# Patient Record
Sex: Female | Born: 1955 | Race: Black or African American | Hispanic: No | Smoking: Former smoker
Health system: Southern US, Community
[De-identification: ages and names within clinical notes are randomized; demographics above are authoritative.]

## PROBLEM LIST (undated history)

## (undated) ENCOUNTER — Emergency Department (HOSPITAL_BASED_OUTPATIENT_CLINIC_OR_DEPARTMENT_OTHER): Admission: EM | Payer: BLUE CROSS/BLUE SHIELD | Source: Home / Self Care

## (undated) DIAGNOSIS — E785 Hyperlipidemia, unspecified: Secondary | ICD-10-CM

## (undated) DIAGNOSIS — E079 Disorder of thyroid, unspecified: Secondary | ICD-10-CM

## (undated) DIAGNOSIS — N63 Unspecified lump in unspecified breast: Secondary | ICD-10-CM

## (undated) DIAGNOSIS — K648 Other hemorrhoids: Secondary | ICD-10-CM

## (undated) DIAGNOSIS — I10 Essential (primary) hypertension: Secondary | ICD-10-CM

## (undated) DIAGNOSIS — E039 Hypothyroidism, unspecified: Secondary | ICD-10-CM

## (undated) DIAGNOSIS — E119 Type 2 diabetes mellitus without complications: Secondary | ICD-10-CM

## (undated) HISTORY — DX: Unspecified lump in unspecified breast: N63.0

## (undated) HISTORY — DX: Other hemorrhoids: K64.8

## (undated) HISTORY — PX: ABDOMINAL HYSTERECTOMY: SHX81

## (undated) HISTORY — PX: APPENDECTOMY: SHX54

## (undated) HISTORY — PX: DILATION AND CURETTAGE OF UTERUS: SHX78

---

## 1998-01-12 ENCOUNTER — Ambulatory Visit (HOSPITAL_COMMUNITY): Admission: RE | Admit: 1998-01-12 | Discharge: 1998-01-12 | Payer: Self-pay

## 2001-09-23 ENCOUNTER — Other Ambulatory Visit: Admission: RE | Admit: 2001-09-23 | Discharge: 2001-09-23 | Payer: Self-pay

## 2001-10-22 ENCOUNTER — Ambulatory Visit (HOSPITAL_COMMUNITY): Admission: RE | Admit: 2001-10-22 | Discharge: 2001-10-22 | Payer: Self-pay

## 2002-11-24 ENCOUNTER — Ambulatory Visit (HOSPITAL_COMMUNITY): Admission: RE | Admit: 2002-11-24 | Discharge: 2002-11-24 | Payer: Self-pay | Admitting: Family Medicine

## 2004-07-05 ENCOUNTER — Ambulatory Visit (HOSPITAL_COMMUNITY): Admission: RE | Admit: 2004-07-05 | Discharge: 2004-07-05 | Payer: Self-pay | Admitting: Family Medicine

## 2005-11-28 ENCOUNTER — Ambulatory Visit (HOSPITAL_COMMUNITY): Admission: RE | Admit: 2005-11-28 | Discharge: 2005-11-28 | Payer: Self-pay | Admitting: Family Medicine

## 2007-10-22 ENCOUNTER — Encounter: Admission: RE | Admit: 2007-10-22 | Discharge: 2007-10-22 | Payer: Self-pay | Admitting: Family Medicine

## 2008-07-23 ENCOUNTER — Ambulatory Visit (HOSPITAL_COMMUNITY): Admission: RE | Admit: 2008-07-23 | Discharge: 2008-07-23 | Payer: Self-pay | Admitting: Family Medicine

## 2009-09-06 ENCOUNTER — Ambulatory Visit (HOSPITAL_COMMUNITY): Admission: RE | Admit: 2009-09-06 | Discharge: 2009-09-06 | Payer: Self-pay | Admitting: Family Medicine

## 2010-05-24 ENCOUNTER — Encounter: Admission: RE | Admit: 2010-05-24 | Discharge: 2010-05-24 | Payer: Self-pay | Admitting: Family Medicine

## 2010-07-16 ENCOUNTER — Encounter: Payer: Self-pay | Admitting: Family Medicine

## 2011-06-20 ENCOUNTER — Other Ambulatory Visit: Payer: Self-pay | Admitting: Family Medicine

## 2011-06-20 DIAGNOSIS — N6310 Unspecified lump in the right breast, unspecified quadrant: Secondary | ICD-10-CM

## 2011-07-04 ENCOUNTER — Inpatient Hospital Stay: Admission: RE | Admit: 2011-07-04 | Payer: Self-pay | Source: Ambulatory Visit

## 2011-07-11 ENCOUNTER — Ambulatory Visit
Admission: RE | Admit: 2011-07-11 | Discharge: 2011-07-11 | Disposition: A | Discharge status: Home / Self Care | Payer: Medicaid Other | Source: Ambulatory Visit | Attending: Family Medicine | Admitting: Family Medicine

## 2011-07-11 DIAGNOSIS — N6310 Unspecified lump in the right breast, unspecified quadrant: Secondary | ICD-10-CM

## 2011-12-28 ENCOUNTER — Other Ambulatory Visit (HOSPITAL_COMMUNITY)
Admission: RE | Admit: 2011-12-28 | Discharge: 2011-12-28 | Disposition: A | Discharge status: Home / Self Care | Payer: Medicaid Other | Source: Ambulatory Visit | Attending: Obstetrics and Gynecology | Admitting: Obstetrics and Gynecology

## 2011-12-28 ENCOUNTER — Other Ambulatory Visit: Payer: Self-pay | Admitting: Obstetrics and Gynecology

## 2011-12-28 DIAGNOSIS — Z124 Encounter for screening for malignant neoplasm of cervix: Secondary | ICD-10-CM | POA: Insufficient documentation

## 2012-10-06 ENCOUNTER — Other Ambulatory Visit: Payer: Self-pay

## 2012-10-16 ENCOUNTER — Other Ambulatory Visit (HOSPITAL_COMMUNITY): Payer: Self-pay | Admitting: Family Medicine

## 2012-10-16 DIAGNOSIS — Z1231 Encounter for screening mammogram for malignant neoplasm of breast: Secondary | ICD-10-CM

## 2012-10-23 ENCOUNTER — Ambulatory Visit (HOSPITAL_COMMUNITY)
Admission: RE | Admit: 2012-10-23 | Discharge: 2012-10-23 | Disposition: A | Discharge status: Home / Self Care | Payer: Self-pay | Source: Ambulatory Visit | Attending: Family Medicine | Admitting: Family Medicine

## 2012-10-23 DIAGNOSIS — Z1231 Encounter for screening mammogram for malignant neoplasm of breast: Secondary | ICD-10-CM | POA: Insufficient documentation

## 2012-10-27 ENCOUNTER — Other Ambulatory Visit: Payer: Self-pay | Admitting: Family Medicine

## 2012-10-27 DIAGNOSIS — R928 Other abnormal and inconclusive findings on diagnostic imaging of breast: Secondary | ICD-10-CM

## 2012-11-18 ENCOUNTER — Ambulatory Visit (HOSPITAL_COMMUNITY)
Admission: RE | Admit: 2012-11-18 | Discharge: 2012-11-18 | Disposition: A | Discharge status: Home / Self Care | Payer: Medicaid Other | Source: Ambulatory Visit | Attending: Obstetrics and Gynecology | Admitting: Obstetrics and Gynecology

## 2012-11-18 ENCOUNTER — Encounter (HOSPITAL_COMMUNITY): Payer: Self-pay

## 2012-11-18 VITALS — BP 138/84 | Temp 98.7°F | Ht 69.0 in | Wt 218.6 lb

## 2012-11-18 DIAGNOSIS — Z1239 Encounter for other screening for malignant neoplasm of breast: Secondary | ICD-10-CM

## 2012-11-18 HISTORY — DX: Essential (primary) hypertension: I10

## 2012-11-18 NOTE — Progress Notes (Signed)
Patient referred to [facility] by the [facility] due to needing additional imaging of the right breast. Screening mammogram completed 10/23/2012 at [facility].  Pap Smear:    Pap smear not completed today. Last Pap smear was 12/28/2011 and normal. Per patient has no history of an abnormal Pap smear. Patient has a history of a hysterectomy in 2012 for fibroids. Last Pap smear result is in EPIC. Patient does not need any further Pap smears due to her history of a hysterectomy for benign reasons per ACOG and BCCCP guidelines.  Physical exam: Breasts Breasts symmetrical. No skin abnormalities bilateral breasts. No nipple retraction bilateral breasts. No nipple discharge bilateral breasts. No lymphadenopathy. No lumps palpated bilateral breasts. No complaints of pain or tenderness on exam. Referred patient to the [facility] for right breast diagnostic mammogram and possible ultrasound per recommendation. Appointment scheduled for Friday, Nov 21, 2012 at 1100      Pelvic/Bimanual No Pap smear completed today since last Pap smear was 12/28/2011 and a history of a hysterectomy for benign reasons. Pap smear not indicated per BCCCP guidelines.

## 2012-11-18 NOTE — Patient Instructions (Signed)
Taught patient how to perform BSE. Patient did not need a Pap smear today due to last Pap smear was 12/28/2011 and a history of a hysterectomy for benign reasons. Let patient know that she will not need any further Pap smears due to her history of a hysterectomy for benign reasons. Referred patient to the [facility] for right breast diagnostic mammogram and possible ultrasound per recommendation. Appointment scheduled for Friday, Nov 21, 2012 at 1100. Patient aware of appointment and will be there.  Patient verbalized understanding.

## 2012-11-21 ENCOUNTER — Ambulatory Visit
Admission: RE | Admit: 2012-11-21 | Discharge: 2012-11-21 | Disposition: A | Discharge status: Home / Self Care | Payer: No Typology Code available for payment source | Source: Ambulatory Visit | Attending: Family Medicine | Admitting: Family Medicine

## 2012-11-21 DIAGNOSIS — R928 Other abnormal and inconclusive findings on diagnostic imaging of breast: Secondary | ICD-10-CM

## 2013-01-09 ENCOUNTER — Encounter (HOSPITAL_BASED_OUTPATIENT_CLINIC_OR_DEPARTMENT_OTHER): Payer: Self-pay | Admitting: *Deleted

## 2013-01-09 ENCOUNTER — Emergency Department (HOSPITAL_BASED_OUTPATIENT_CLINIC_OR_DEPARTMENT_OTHER)
Admission: EM | Admit: 2013-01-09 | Discharge: 2013-01-09 | Disposition: A | Discharge status: Home / Self Care | Payer: PRIVATE HEALTH INSURANCE | Attending: Emergency Medicine | Admitting: Emergency Medicine

## 2013-01-09 DIAGNOSIS — I10 Essential (primary) hypertension: Secondary | ICD-10-CM | POA: Insufficient documentation

## 2013-01-09 DIAGNOSIS — L02419 Cutaneous abscess of limb, unspecified: Secondary | ICD-10-CM | POA: Insufficient documentation

## 2013-01-09 DIAGNOSIS — Z79899 Other long term (current) drug therapy: Secondary | ICD-10-CM | POA: Insufficient documentation

## 2013-01-09 DIAGNOSIS — L0390 Cellulitis, unspecified: Secondary | ICD-10-CM

## 2013-01-09 MED ORDER — SULFAMETHOXAZOLE-TRIMETHOPRIM 800-160 MG PO TABS: 1.0000 | ORAL_TABLET | Freq: Two times a day (BID) | ORAL | Status: DC

## 2013-01-09 MED ORDER — HYDROCODONE-ACETAMINOPHEN 5-325 MG PO TABS: 1.0000 | ORAL_TABLET | Freq: Four times a day (QID) | ORAL | Status: DC | PRN

## 2013-01-09 MED ORDER — IBUPROFEN 600 MG PO TABS: 600.0000 mg | ORAL_TABLET | Freq: Four times a day (QID) | ORAL | Status: DC | PRN

## 2013-01-09 MED ORDER — SULFAMETHOXAZOLE-TMP DS 800-160 MG PO TABS
1.0000 | ORAL_TABLET | Freq: Once | ORAL | Status: AC
  Administered 2013-01-09: 1 via ORAL
  Filled 2013-01-09: qty 1

## 2013-01-09 NOTE — ED Notes (Signed)
Red, hot, swollen, painful area to her right lower leg. States she feels she was bit by a spider.

## 2013-01-09 NOTE — ED Provider Notes (Signed)
History    CSN: [id number] Arrival date & time 01/09/13  2256  First MD Initiated Contact with Patient 01/09/13 2311     Chief Complaint  Patient presents with  . Rash   (Consider location/radiation/quality/duration/timing/severity/associated sxs/prior Treatment) Patient is a 57 y.o. female presenting with rash. The history is provided by the patient. No language interpreter was used.  Rash Pain location: right lower leg medially and just distal to the knee. Pain quality: aching   Pain radiates to:  Does not radiate Pain severity:  Moderate Onset quality:  Gradual Duration:  1 day Timing:  Constant Progression:  Unchanged Chronicity:  New Context: not trauma   Relieved by:  Nothing Worsened by:  Nothing tried Ineffective treatments:  None tried Associated symptoms: no fever   Risk factors: not pregnant    Past Medical History  Diagnosis Date  . Hypertension    Past Surgical History  Procedure Laterality Date  . Appendectomy     Family History  Problem Relation Age of Onset  . Diabetes Mother   . Hypertension Mother    History  Substance Use Topics  . Smoking status: Never Smoker   . Smokeless tobacco: Never Used  . Alcohol Use: No   OB History   Grav Para Term Preterm Abortions TAB SAB Ect Mult Living   4 2 2  2 2    2      Review of Systems  Constitutional: Negative for fever.  Skin: Positive for rash.  All other systems reviewed and are negative.    Allergies  Review of patient's allergies indicates no known allergies.  Home Medications   Current Outpatient Rx  Name  Route  Sig  Dispense  Refill  . HYDROcodone-acetaminophen (NORCO) 5-325 MG per tablet   Oral   Take 1 tablet by mouth every 6 (six) hours as needed for pain.   10 tablet   0   . ibuprofen (ADVIL,MOTRIN) 600 MG tablet   Oral   Take 1 tablet (600 mg total) by mouth every 6 (six) hours as needed for pain.   30 tablet   0   . levothyroxine (SYNTHROID, LEVOTHROID) 75 MCG  tablet   Oral   Take 75 mcg by mouth daily before breakfast.         . metoprolol succinate (TOPROL-XL) 25 MG 24 hr tablet   Oral   Take 25 mg by mouth daily.         . simvastatin (ZOCOR) 40 MG tablet   Oral   Take 40 mg by mouth every evening.         . sulfamethoxazole-trimethoprim (BACTRIM DS,SEPTRA DS) 800-160 MG per tablet   Oral   Take 1 tablet by mouth 2 (two) times daily. One po bid x 7 days   14 tablet   0    BP 163/87  Pulse 63  Temp(Src) 98.8 F (37.1 C)  Resp 20  Wt 218 lb (98.884 kg)  BMI 32.18 kg/m2  SpO2 98% Physical Exam  Constitutional: She is oriented to person, place, and time. She appears well-developed and well-nourished. No distress.  HENT:  Head: Normocephalic and atraumatic.  Mouth/Throat: Oropharynx is clear and moist.  Eyes: Conjunctivae are normal. Pupils are equal, round, and reactive to light.  Neck: Normal range of motion.  Cardiovascular: Normal rate, regular rhythm and intact distal pulses.   Pulmonary/Chest: Effort normal and breath sounds normal. She has no wheezes. She has no rales.  Abdominal: Soft. Bowel sounds are  normal. There is no tenderness.  Musculoskeletal: Normal range of motion.  Neurological: She is alert and oriented to person, place, and time.  Skin: Skin is warm and dry. There is erythema.     Psychiatric: She has a normal mood and affect.    ED Course  Procedures (including critical care time) Labs Reviewed - No data to display No results found. 1. Cellulitis     MDM  Will treat for community acquired MRSA.  Return for fevers, vomiting, streaking up the legs drainage or any concerns  Terren Haberle K Lewis Grivas-Rasch, MD 01/09/13 2338

## 2013-08-24 ENCOUNTER — Emergency Department (HOSPITAL_BASED_OUTPATIENT_CLINIC_OR_DEPARTMENT_OTHER)
Admission: EM | Admit: 2013-08-24 | Discharge: 2013-08-24 | Disposition: A | Discharge status: Home / Self Care | Payer: BC Managed Care – PPO | Attending: Emergency Medicine | Admitting: Emergency Medicine

## 2013-08-24 ENCOUNTER — Encounter (HOSPITAL_BASED_OUTPATIENT_CLINIC_OR_DEPARTMENT_OTHER): Payer: Self-pay | Admitting: Emergency Medicine

## 2013-08-24 ENCOUNTER — Emergency Department (HOSPITAL_BASED_OUTPATIENT_CLINIC_OR_DEPARTMENT_OTHER): Payer: BC Managed Care – PPO

## 2013-08-24 DIAGNOSIS — E785 Hyperlipidemia, unspecified: Secondary | ICD-10-CM | POA: Insufficient documentation

## 2013-08-24 DIAGNOSIS — R0602 Shortness of breath: Secondary | ICD-10-CM | POA: Insufficient documentation

## 2013-08-24 DIAGNOSIS — I10 Essential (primary) hypertension: Secondary | ICD-10-CM | POA: Insufficient documentation

## 2013-08-24 DIAGNOSIS — R059 Cough, unspecified: Secondary | ICD-10-CM

## 2013-08-24 DIAGNOSIS — E079 Disorder of thyroid, unspecified: Secondary | ICD-10-CM | POA: Insufficient documentation

## 2013-08-24 DIAGNOSIS — R05 Cough: Secondary | ICD-10-CM

## 2013-08-24 DIAGNOSIS — Z79899 Other long term (current) drug therapy: Secondary | ICD-10-CM | POA: Insufficient documentation

## 2013-08-24 DIAGNOSIS — J069 Acute upper respiratory infection, unspecified: Secondary | ICD-10-CM | POA: Insufficient documentation

## 2013-08-24 HISTORY — DX: Hyperlipidemia, unspecified: E78.5

## 2013-08-24 HISTORY — DX: Disorder of thyroid, unspecified: E07.9

## 2013-08-24 MED ORDER — FEXOFENADINE HCL 60 MG PO TABS: 60.0000 mg | ORAL_TABLET | Freq: Two times a day (BID) | ORAL | Status: DC

## 2013-08-24 MED ORDER — HYDROCODONE-HOMATROPINE 5-1.5 MG/5ML PO SYRP: 5.0000 mL | ORAL_SOLUTION | Freq: Four times a day (QID) | ORAL | Status: DC | PRN

## 2013-08-24 NOTE — ED Notes (Signed)
Cold symptoms since Saturday. States started wheezing with coughing today. No history of asthma

## 2013-08-24 NOTE — Discharge Instructions (Signed)
Antibiotic Resistance  Antibiotics are drugs. They fight infections caused by bacteria. Antibiotics greatly reduce illness and death from infectious diseases. Over time, the bacteria that antibiotics once controlled are much harder to kill.  CAUSES   Antibiotic resistance occurs when bacteria change in some way. These changes can lessen the abilities of drugs designed to cure infections. The over-use of antibiotics can cause antibiotic resistance. Almost all important bacterial infections in the world are becoming resistant to drugs. Antibiotic resistance has been called one of the world's most pressing public health problems.   Antibiotics should be used to treat bacterial infections. But they are not effective against viral infections. These include the common cold, most sore throats, and the flu. Smart use of antibiotics will control the spread of resistance.   TREATMENT    Only use antibiotics as prescribed by your caregiver.   Talk with your caregiver about antibiotic resistance.   Ask what else you can do to feel better.   Do not take an antibiotic for a viral infection. This could be a cold, cough or the flu.   Do not save some of your antibiotic for the next time you get sick.   Take an antibiotic exactly as the caregiver tells you.   Do not take an antibiotic that is prescribed for someone else.   Use the antibiotic as directed. Take the correct dose at the scheduled time.  SEEK MEDICAL CARE IF:   You react to the antibiotic with:   A rash.   Itching.   An upset stomach.  Document Released: 09/01/2002 Document Revised: 09/03/2011 Document Reviewed: 04/05/2008  ExitCare Patient Information 2014 [employer].

## 2013-08-24 NOTE — ED Provider Notes (Signed)
CSN: [id number]     Arrival date & time 08/24/13  4098 History   First MD Initiated Contact with Patient 08/24/13 1018     Chief Complaint  Patient presents with  . URI  . Cough     (Consider location/radiation/quality/duration/timing/severity/associated sxs/prior Treatment) HPI Comments: Pt states that she started having cough and congestion 2 days ago. Used otc medications without relief. No definite fever. Worse at night. Non smoking.  The history is provided by the patient. No language interpreter was used.    Past Medical History  Diagnosis Date  . Hypertension   . Hyperlipemia   . Thyroid disease    Past Surgical History  Procedure Laterality Date  . Appendectomy     Family History  Problem Relation Age of Onset  . Diabetes Mother   . Hypertension Mother    History  Substance Use Topics  . Smoking status: Never Smoker   . Smokeless tobacco: Never Used  . Alcohol Use: No   OB History   Grav Para Term Preterm Abortions TAB SAB Ect Mult Living   4 2 2  2 2    2      Review of Systems  Constitutional: Negative.   Respiratory: Positive for cough and shortness of breath.   Cardiovascular: Negative.       Allergies  Review of patient's allergies indicates no known allergies.  Home Medications   Current Outpatient Rx  Name  Route  Sig  Dispense  Refill  . levothyroxine (SYNTHROID, LEVOTHROID) 75 MCG tablet   Oral   Take 75 mcg by mouth daily before breakfast.         . metoprolol succinate (TOPROL-XL) 25 MG 24 hr tablet   Oral   Take 25 mg by mouth daily.         . simvastatin (ZOCOR) 40 MG tablet   Oral   Take 40 mg by mouth every evening.         Marland Kitchen HYDROcodone-acetaminophen (NORCO) 5-325 MG per tablet   Oral   Take 1 tablet by mouth every 6 (six) hours as needed for pain.   10 tablet   0   . ibuprofen (ADVIL,MOTRIN) 600 MG tablet   Oral   Take 1 tablet (600 mg total) by mouth every 6 (six) hours as needed for pain.   30 tablet   0    . sulfamethoxazole-trimethoprim (BACTRIM DS,SEPTRA DS) 800-160 MG per tablet   Oral   Take 1 tablet by mouth 2 (two) times daily. One po bid x 7 days   14 tablet   0    BP 136/81  Pulse 79  Temp(Src) 98.8 F (37.1 C) (Oral)  Resp 18  Wt 210 lb (95.255 kg)  SpO2 99% Physical Exam  Nursing note and vitals reviewed. Constitutional: She is oriented to person, place, and time. She appears well-developed and well-nourished.  HENT:  Right Ear: External ear normal.  Left Ear: External ear normal.  Nose: Mucosal edema present.  Mouth/Throat: Posterior oropharyngeal erythema present.  Eyes: Conjunctivae and EOM are normal. Pupils are equal, round, and reactive to light.  Neck: Normal range of motion. Neck supple.  Cardiovascular: Normal rate and regular rhythm.   Pulmonary/Chest: Effort normal and breath sounds normal.  Abdominal: Soft. Bowel sounds are normal. There is no tenderness.  Musculoskeletal: Normal range of motion.  Neurological: She is alert and oriented to person, place, and time. Coordination normal.  Skin: Skin is warm and dry.  Psychiatric: She has  a normal mood and affect.    ED Course  Procedures (including critical care time) Labs Review Labs Reviewed - No data to display Imaging Review Dg Chest 2 View  08/24/2013   CLINICAL DATA:  Fever and cough  EXAM: CHEST  2 VIEW  COMPARISON:  May 04, 2013  FINDINGS: Lungs are clear. Heart size and pulmonary vascularity are normal. No adenopathy. No bone lesions.  IMPRESSION: No abnormality noted.   Electronically Signed   By: Bretta BangWilliam  Woodruff M.D.   On: 08/24/2013 11:07     EKG Interpretation None      MDM   Final diagnoses:  URI (upper respiratory infection)  Cough    Likely viral. Will treat symptomatically. No need for antibiotics at this time   Teressa LowerVrinda Katelee Schupp, NP 08/24/13 1143

## 2013-08-24 NOTE — ED Provider Notes (Signed)
Medical screening examination/treatment/procedure(s) were performed by non-physician practitioner and as supervising physician I was immediately available for consultation/collaboration.   EKG Interpretation None        Donnice Nielsen, MD 08/24/13 1513 

## 2013-11-07 ENCOUNTER — Encounter (HOSPITAL_BASED_OUTPATIENT_CLINIC_OR_DEPARTMENT_OTHER): Payer: Self-pay | Admitting: Emergency Medicine

## 2013-11-07 ENCOUNTER — Emergency Department (HOSPITAL_BASED_OUTPATIENT_CLINIC_OR_DEPARTMENT_OTHER)
Admission: EM | Admit: 2013-11-07 | Discharge: 2013-11-08 | Discharge status: Against Medical Advice | Payer: BC Managed Care – PPO | Attending: Emergency Medicine | Admitting: Emergency Medicine

## 2013-11-07 DIAGNOSIS — R51 Headache: Secondary | ICD-10-CM | POA: Insufficient documentation

## 2013-11-07 DIAGNOSIS — I10 Essential (primary) hypertension: Secondary | ICD-10-CM | POA: Insufficient documentation

## 2013-11-07 NOTE — ED Notes (Signed)
Pt c/o headache onset x2 days denies Hx of headache or migraines

## 2013-11-07 NOTE — ED Notes (Signed)
Second call to waiting area w/o reply pt eloped

## 2013-11-07 NOTE — ED Notes (Signed)
Call to waiting area w/o eply

## 2014-04-25 DIAGNOSIS — I209 Angina pectoris, unspecified: Secondary | ICD-10-CM

## 2014-04-26 ENCOUNTER — Encounter (HOSPITAL_COMMUNITY)
Admission: RE | Disposition: A | Discharge status: Home / Self Care | Payer: Self-pay | Source: Ambulatory Visit | Attending: Cardiology

## 2014-04-26 ENCOUNTER — Ambulatory Visit (HOSPITAL_COMMUNITY)
Admission: RE | Admit: 2014-04-26 | Discharge: 2014-04-26 | Disposition: A | Discharge status: Home / Self Care | Payer: BC Managed Care – PPO | Source: Ambulatory Visit | Attending: Cardiology | Admitting: Cardiology

## 2014-04-26 ENCOUNTER — Encounter (HOSPITAL_BASED_OUTPATIENT_CLINIC_OR_DEPARTMENT_OTHER): Payer: Self-pay | Admitting: Emergency Medicine

## 2014-04-26 DIAGNOSIS — E785 Hyperlipidemia, unspecified: Secondary | ICD-10-CM | POA: Diagnosis not present

## 2014-04-26 DIAGNOSIS — I209 Angina pectoris, unspecified: Secondary | ICD-10-CM

## 2014-04-26 DIAGNOSIS — I10 Essential (primary) hypertension: Secondary | ICD-10-CM | POA: Insufficient documentation

## 2014-04-26 DIAGNOSIS — E119 Type 2 diabetes mellitus without complications: Secondary | ICD-10-CM | POA: Insufficient documentation

## 2014-04-26 DIAGNOSIS — R0602 Shortness of breath: Secondary | ICD-10-CM | POA: Diagnosis not present

## 2014-04-26 DIAGNOSIS — R079 Chest pain, unspecified: Secondary | ICD-10-CM | POA: Insufficient documentation

## 2014-04-26 HISTORY — PX: LEFT HEART CATHETERIZATION WITH CORONARY ANGIOGRAM: SHX5451

## 2014-04-26 LAB — BASIC METABOLIC PANEL
Anion gap: 13 (ref 5–15)
BUN: 17 mg/dL (ref 6–23)
CHLORIDE: 103 meq/L (ref 96–112)
CO2: 25 meq/L (ref 19–32)
Calcium: 9.8 mg/dL (ref 8.4–10.5)
Creatinine, Ser: 0.96 mg/dL (ref 0.50–1.10)
GFR calc Af Amer: 75 mL/min — ABNORMAL LOW (ref >90)
GFR, EST NON AFRICAN AMERICAN: 65 mL/min — AB (ref >90)
GLUCOSE: 106 mg/dL — AB (ref 70–99)
POTASSIUM: 3.7 meq/L (ref 3.7–5.3)
SODIUM: 141 meq/L (ref 137–147)

## 2014-04-26 LAB — CBC
HEMATOCRIT: 40.9 % (ref 36.0–46.0)
Hemoglobin: 13.9 g/dL (ref 12.0–15.0)
MCH: 29.7 pg (ref 26.0–34.0)
MCHC: 34 g/dL (ref 30.0–36.0)
MCV: 87.4 fL (ref 78.0–100.0)
Platelets: 248 10*3/uL (ref 150–400)
RBC: 4.68 MIL/uL (ref 3.87–5.11)
RDW: 13.8 % (ref 11.5–15.5)
WBC: 6.1 10*3/uL (ref 4.0–10.5)

## 2014-04-26 LAB — PROTIME-INR
INR: 0.99 (ref 0.00–1.49)
Prothrombin Time: 13.2 seconds (ref 11.6–15.2)

## 2014-04-26 LAB — POCT ACTIVATED CLOTTING TIME: Activated Clotting Time: 219 seconds

## 2014-04-26 SURGERY — LEFT HEART CATHETERIZATION WITH CORONARY ANGIOGRAM
Anesthesia: LOCAL

## 2014-04-26 MED ORDER — SODIUM CHLORIDE 0.9 % IJ SOLN: 3.0000 mL | Freq: Two times a day (BID) | INTRAMUSCULAR | Status: DC

## 2014-04-26 MED ORDER — ONDANSETRON HCL 4 MG/2ML IJ SOLN
4.0000 mg | Freq: Once | INTRAMUSCULAR | Status: AC
  Administered 2014-04-26: 4 mg via INTRAVENOUS

## 2014-04-26 MED ORDER — SODIUM CHLORIDE 0.9 % IJ SOLN: 3.0000 mL | INTRAMUSCULAR | Status: DC | PRN

## 2014-04-26 MED ORDER — HEPARIN SODIUM (PORCINE) 1000 UNIT/ML IJ SOLN
INTRAMUSCULAR | Status: AC
  Filled 2014-04-26: qty 1

## 2014-04-26 MED ORDER — SODIUM CHLORIDE 0.9 % IV SOLN
INTRAVENOUS | Status: DC
  Administered 2014-04-26: 06:00:00 via INTRAVENOUS

## 2014-04-26 MED ORDER — LIDOCAINE HCL (PF) 1 % IJ SOLN
INTRAMUSCULAR | Status: AC
  Filled 2014-04-26: qty 30

## 2014-04-26 MED ORDER — HYDROMORPHONE HCL 1 MG/ML IJ SOLN
INTRAMUSCULAR | Status: AC
  Filled 2014-04-26: qty 1

## 2014-04-26 MED ORDER — HEPARIN (PORCINE) IN NACL 2-0.9 UNIT/ML-% IJ SOLN
INTRAMUSCULAR | Status: AC
  Filled 2014-04-26: qty 1000

## 2014-04-26 MED ORDER — SODIUM CHLORIDE 0.9 % IV SOLN: 250.0000 mL | INTRAVENOUS | Status: DC | PRN

## 2014-04-26 MED ORDER — IBUPROFEN 600 MG PO TABS: 600.0000 mg | ORAL_TABLET | Freq: Four times a day (QID) | ORAL | Status: DC | PRN

## 2014-04-26 MED ORDER — MIDAZOLAM HCL 2 MG/2ML IJ SOLN
INTRAMUSCULAR | Status: AC
  Filled 2014-04-26: qty 2

## 2014-04-26 MED ORDER — NITROGLYCERIN 1 MG/10 ML FOR IR/CATH LAB
INTRA_ARTERIAL | Status: AC
  Filled 2014-04-26: qty 10

## 2014-04-26 MED ORDER — VERAPAMIL HCL 2.5 MG/ML IV SOLN
INTRAVENOUS | Status: AC
  Filled 2014-04-26: qty 2

## 2014-04-26 MED ORDER — SODIUM CHLORIDE 0.9 % IV SOLN: 1.0000 mL/kg/h | INTRAVENOUS | Status: DC

## 2014-04-26 MED ORDER — ONDANSETRON HCL 4 MG/2ML IJ SOLN
INTRAMUSCULAR | Status: AC
  Filled 2014-04-26: qty 2

## 2014-04-26 MED ORDER — ASPIRIN 81 MG PO CHEW
81.0000 mg | CHEWABLE_TABLET | ORAL | Status: AC
  Administered 2014-04-26: 81 mg via ORAL

## 2014-04-26 MED ORDER — ASPIRIN 81 MG PO CHEW
CHEWABLE_TABLET | ORAL | Status: AC
  Administered 2014-04-26: 81 mg via ORAL
  Filled 2014-04-26: qty 1

## 2014-04-26 NOTE — Progress Notes (Signed)
Dr. Jacinto HalimGanji by to speak w/patient. CP now 2/10. 12-lead EKG done per order.

## 2014-04-26 NOTE — Progress Notes (Signed)
Patient denies chest pain nor any discomfort.

## 2014-04-26 NOTE — H&P (Signed)
  Please see office visit notes for complete details of HPI.  

## 2014-04-26 NOTE — Discharge Instructions (Signed)
Radial Site Care °Refer to this sheet in the next few weeks. These instructions provide you with information on caring for yourself after your procedure. Your caregiver may also give you more specific instructions. Your treatment has been planned according to current medical practices, but problems sometimes occur. Call your caregiver if you have any problems or questions after your procedure. °HOME CARE INSTRUCTIONS °· You may shower the day after the procedure. Remove the bandage (dressing) and gently wash the site with plain soap and water. Gently pat the site dry. °· Do not apply powder or lotion to the site. °· Do not submerge the affected site in water for 3 to 5 days. °· Inspect the site at least twice daily. °· Do not flex or bend the affected arm for 24 hours. °· No lifting over 5 pounds (2.3 kg) for 5 days after your procedure. °· Do not drive home if you are discharged the same day of the procedure. Have someone else drive you. °· You may drive 24 hours after the procedure unless otherwise instructed by your caregiver. °· Do not operate machinery or power tools for 24 hours. °· A responsible adult should be with you for the first 24 hours after you arrive home. °What to expect: °· Any bruising will usually fade within 1 to 2 weeks. °· Blood that collects in the tissue (hematoma) may be painful to the touch. It should usually decrease in size and tenderness within 1 to 2 weeks. °SEEK IMMEDIATE MEDICAL CARE IF: °· You have unusual pain at the radial site. °· You have redness, warmth, swelling, or pain at the radial site. °· You have drainage (other than a small amount of blood on the dressing). °· You have chills. °· You have a fever or persistent symptoms for more than 72 hours. °· You have a fever and your symptoms suddenly get worse. °· Your arm becomes pale, cool, tingly, or numb. °· You have heavy bleeding from the site. Hold pressure on the site. °Document Released: 07/14/2010 Document Revised:  09/03/2011 Document Reviewed: 07/14/2010 °ExitCare® Patient Information ©2015 [employer]. This information is not intended to replace advice given to you by your health care provider. Make sure you discuss any questions you have with your health care provider. ° °

## 2014-04-26 NOTE — Interval H&P Note (Signed)
History and Physical Interval Note:  04/26/2014 7:49 AM  Patricia AlexandersMarie Rodgers  has presented today for surgery, with the diagnosis of cp  The various methods of treatment have been discussed with the patient and family. After consideration of risks, benefits and other options for treatment, the patient has consented to  Procedure(s): LEFT HEART CATHETERIZATION WITH CORONARY ANGIOGRAM (N/A) and PCI as a surgical intervention .  The patient's history has been reviewed, patient examined, no change in status, stable for surgery.  I have reviewed the patient's chart and labs.  Questions were answered to the patient's satisfaction.    Cath Lab Visit (complete for each Cath Lab visit)  Clinical Evaluation Leading to the Procedure:   ACS: No.  Non-ACS:    Anginal Classification: CCS III  Anti-ischemic medical therapy: Maximal Therapy (2 or more classes of medications)  Non-Invasive Test Results: Intermediate-risk stress test findings: cardiac mortality 1-3%/year  Prior CABG: No previous CABG       [facility] LLC[facility]JI,JAGADEESH R

## 2014-04-26 NOTE — Interval H&P Note (Signed)
History and Physical Interval Note:  04/26/2014 7:48 AM  Patricia AlexandersMarie Rodgers  has presented today for surgery, with the diagnosis of cp  The various methods of treatment have been discussed with the patient and family. After consideration of risks, benefits and other options for treatment, the patient has consented to  Procedure(s): LEFT HEART CATHETERIZATION WITH CORONARY ANGIOGRAM (N/A) and possible PCI  as a surgical intervention .  The patient's history has been reviewed, patient examined, no change in status, stable for surgery.  I have reviewed the patient's chart and labs.  Questions were answered to the patient's satisfaction.     Pamella PertGANJI,JAGADEESH R

## 2014-04-26 NOTE — CV Procedure (Addendum)
Procedure performed:  Left heart catheterization including hemodynamic monitoring of the left ventricle, LV gram, selective right and left coronary arteriography.  Indication patient is a 58 year-old AA female with history of hypertension,  hyperlipidemia, who presents with chest pain suggestive of angina pectoris. Patient has  had non invasive testing which was abnormal revealing ischemia and intermediate risk scan and patient continued to have ntg responsive chest pain.  Hence is brought to the cardiac catheterization lab to evaluate the  coronary anatomy for definitive diagnosis of CAD.  Hemodynamic data:  Left ventricular pressure was 143/5 with LVEDP of 17 mm mercury. Aortic pressure was 142/84 with a mean of 110 mm mercury. There was no pressure gradient across the aortic valve  Left ventricle: Performed in the RAO projection revealed LVEF of 60%. There was no significant MR. no wall motion abnormality. Initial injection of the left ventricle via a 5 [place] Tig catheter lead to intramyocardial injection of the contrast.  Right coronary artery: The vessel is smooth, there is very minimal luminal irregularity. It is Dominant.  Left main coronary artery is large and normal.  Circumflex coronary artery: A large vessel giving origin to a large obtuse marginal 1. It is smooth and normal.   LAD:  LAD gives origin to several small diagonals. The LAD is smooth and normal.  Technique: Under sterile precautions using a 6 French right radial  arterial access, a 6 French sheath was introduced into the right radial artery. A 5 [place] Tig 4 catheter was advanced into the ascending aorta selective  right coronary artery and left coronary artery was cannulated and angiography was performed in multiple views. The catheter was pulled back Out of the body over exchange length J-wire.  Same Catheter was used to perform LV gram which was performed in RAO projection. Catheter exchanged out of the body over  J-Wire. NO immediate complications noted. Patient tolerated the procedure well.   Complication: Initial injection of the left ventricle lead to intramyocardial blush, intramyocardial injection of the contrast, 8 mL of contrast was injected with the hand. Patient did experience chest pain, which improved. Most of the contrast at the end of the procedure had resolved spontaneously.  Bedside echocardiogram was performed to exclude any pericardial effusion or wall motion abnormality. There was no pericardial effusion and there was normal wall motion. The recordings were not saved.  Rec: Medical therapy with aggressive risk factor reduction. Suspect chest pain of noncardiac etiology.  Disposition: Will be discharged home today with outpatient follow up. She'll be observed for 4-6 hours prior to discharge. A total of 50 mL of contrast was utilized for diagnostic angiography.  Addendum: Post procedure in the recovery area, patient completely asymptomatic. No EKG changes. Patient was discharged from the hospital uneventfully. Post follow-up telephone conversation, patient completely asymptomatic except mild coldness in her hand but was able to move her hands well and had no pain. Patient will be followed up in the outpatient basis.

## 2014-04-26 NOTE — Progress Notes (Signed)
NAUSEA AND VOMITING SMALL AMT YELLOW LIQUID AND STATES FEELS BETTER

## 2014-05-13 ENCOUNTER — Other Ambulatory Visit: Payer: Self-pay

## 2014-05-13 DIAGNOSIS — Z1231 Encounter for screening mammogram for malignant neoplasm of breast: Secondary | ICD-10-CM

## 2014-06-03 ENCOUNTER — Encounter (HOSPITAL_COMMUNITY): Payer: Self-pay | Admitting: Cardiology

## 2014-06-07 ENCOUNTER — Ambulatory Visit
Admission: RE | Admit: 2014-06-07 | Discharge: 2014-06-07 | Disposition: A | Discharge status: Home / Self Care | Payer: BC Managed Care – PPO | Source: Ambulatory Visit

## 2014-06-07 DIAGNOSIS — Z1231 Encounter for screening mammogram for malignant neoplasm of breast: Secondary | ICD-10-CM

## 2014-11-24 ENCOUNTER — Encounter: Payer: Self-pay | Admitting: Physician Assistant

## 2014-12-22 ENCOUNTER — Ambulatory Visit: Payer: PRIVATE HEALTH INSURANCE | Admitting: Physician Assistant

## 2015-03-04 ENCOUNTER — Ambulatory Visit: Payer: PRIVATE HEALTH INSURANCE | Admitting: Gastroenterology

## 2015-11-12 ENCOUNTER — Emergency Department (HOSPITAL_BASED_OUTPATIENT_CLINIC_OR_DEPARTMENT_OTHER)
Admission: EM | Admit: 2015-11-12 | Discharge: 2015-11-12 | Disposition: A | Discharge status: Home / Self Care | Payer: BLUE CROSS/BLUE SHIELD | Attending: Emergency Medicine | Admitting: Emergency Medicine

## 2015-11-12 ENCOUNTER — Encounter (HOSPITAL_BASED_OUTPATIENT_CLINIC_OR_DEPARTMENT_OTHER): Payer: Self-pay | Admitting: Emergency Medicine

## 2015-11-12 ENCOUNTER — Emergency Department (HOSPITAL_BASED_OUTPATIENT_CLINIC_OR_DEPARTMENT_OTHER): Payer: BLUE CROSS/BLUE SHIELD

## 2015-11-12 DIAGNOSIS — R0789 Other chest pain: Secondary | ICD-10-CM | POA: Insufficient documentation

## 2015-11-12 DIAGNOSIS — R079 Chest pain, unspecified: Secondary | ICD-10-CM

## 2015-11-12 DIAGNOSIS — Z87891 Personal history of nicotine dependence: Secondary | ICD-10-CM | POA: Insufficient documentation

## 2015-11-12 DIAGNOSIS — R072 Precordial pain: Secondary | ICD-10-CM | POA: Diagnosis not present

## 2015-11-12 DIAGNOSIS — Z79899 Other long term (current) drug therapy: Secondary | ICD-10-CM | POA: Diagnosis not present

## 2015-11-12 DIAGNOSIS — E785 Hyperlipidemia, unspecified: Secondary | ICD-10-CM | POA: Insufficient documentation

## 2015-11-12 DIAGNOSIS — I10 Essential (primary) hypertension: Secondary | ICD-10-CM | POA: Diagnosis not present

## 2015-11-12 DIAGNOSIS — J3489 Other specified disorders of nose and nasal sinuses: Secondary | ICD-10-CM | POA: Diagnosis not present

## 2015-11-12 LAB — CBC
HEMATOCRIT: 38.4 % (ref 36.0–46.0)
HEMOGLOBIN: 13.3 g/dL (ref 12.0–15.0)
MCH: 30.8 pg (ref 26.0–34.0)
MCHC: 34.6 g/dL (ref 30.0–36.0)
MCV: 88.9 fL (ref 78.0–100.0)
Platelets: 216 10*3/uL (ref 150–400)
RBC: 4.32 MIL/uL (ref 3.87–5.11)
RDW: 13.6 % (ref 11.5–15.5)
WBC: 5.1 10*3/uL (ref 4.0–10.5)

## 2015-11-12 LAB — BASIC METABOLIC PANEL
ANION GAP: 6 (ref 5–15)
BUN: 16 mg/dL (ref 6–20)
CO2: 29 mmol/L (ref 22–32)
Calcium: 9.2 mg/dL (ref 8.9–10.3)
Chloride: 103 mmol/L (ref 101–111)
Creatinine, Ser: 0.93 mg/dL (ref 0.44–1.00)
GFR calc Af Amer: >60 mL/min (ref >60)
GLUCOSE: 128 mg/dL — AB (ref 65–99)
POTASSIUM: 3.1 mmol/L — AB (ref 3.5–5.1)
Sodium: 138 mmol/L (ref 135–145)

## 2015-11-12 LAB — TROPONIN I: Troponin I: <0.03 ng/mL (ref <0.031)

## 2015-11-12 MED ORDER — KETOROLAC TROMETHAMINE 10 MG PO TABS: 10.0000 mg | ORAL_TABLET | Freq: Four times a day (QID) | ORAL | Status: DC | PRN

## 2015-11-12 NOTE — ED Provider Notes (Signed)
CSN: [id number]     Arrival date & time 11/12/15  1823 History   First MD Initiated Contact with Patient 11/12/15 1843     Chief Complaint  Patient presents with  . Chest Pain  PT HERE WITH CP.  SHE SAID THAT SHE HAS HAD HEAVINESS STARTING YESTERDAY THAT STARTED AFTER INSTALLING AN AC UNIT.  THE PT HAS ALSO HAD SINUS PROBLEMS.  THE PT HAD A CATH 04/26/14 THAT WAS NL.  SHE WENT TO THE CARDIOLOGIST FOR SIMILAR SX.    (Consider location/radiation/quality/duration/timing/severity/associated sxs/prior Treatment) Patient is a 60 y.o. female presenting with chest pain. The history is provided by the patient.  Chest Pain Pain location:  Substernal area Pain quality: pressure   Pain radiates to:  Does not radiate Pain radiates to the back: no   Pain severity:  Mild Onset quality:  Gradual Timing:  Constant Progression:  Unchanged Chronicity:  Recurrent   Past Medical History  Diagnosis Date  . Hypertension   . Hyperlipemia   . Thyroid disease   . Internal hemorrhoids   . Breast mass    Past Surgical History  Procedure Laterality Date  . Appendectomy    . Left heart catheterization with coronary angiogram N/A 04/26/2014    Procedure: LEFT HEART CATHETERIZATION WITH CORONARY ANGIOGRAM;  Surgeon: Pamella PertJagadeesh R Ganji, MD;  [facility];  Service: Cardiovascular;  Laterality: N/A;  . Abdominal hysterectomy     Family History  Problem Relation Age of Onset  . Diabetes Mother   . Hypertension Mother   . Heart disease Father    Social History  Substance Use Topics  . Smoking status: Former Games developermoker  . Smokeless tobacco: Never Used  . Alcohol Use: No   OB History    Gravida Para Term Preterm AB TAB SAB Ectopic Multiple Living   4 2 2  2 2    2      Review of Systems  HENT: Positive for sinus pressure.   Cardiovascular: Positive for chest pain.  All other systems reviewed and are negative.     Allergies  Review of patient's allergies indicates no known  allergies.  Home Medications   Prior to Admission medications   Medication Sig Start Date End Date Taking? Authorizing Provider  fexofenadine (ALLEGRA) 60 MG tablet Take 1 tablet (60 mg total) by mouth 2 (two) times daily. 08/24/13   Teressa LowerVrinda Pickering, NP  HYDROcodone-acetaminophen (NORCO) 5-325 MG per tablet Take 1 tablet by mouth every 6 (six) hours as needed for pain. 01/09/13   April Palumbo, MD  HYDROcodone-homatropine (HYDROMET) 5-1.5 MG/5ML syrup Take 5 mLs by mouth every 6 (six) hours as needed for cough. Patient not taking: Reported on 04/26/2014 08/24/13   Teressa LowerVrinda Pickering, NP  ibuprofen (ADVIL,MOTRIN) 600 MG tablet Take 1 tablet (600 mg total) by mouth every 6 (six) hours as needed. 04/28/14   Yates DecampJay Ganji, MD  ketorolac (TORADOL) 10 MG tablet Take 1 tablet (10 mg total) by mouth every 6 (six) hours as needed. 11/12/15   Jacalyn LefevreJulie Leven Hoel, MD  levothyroxine (SYNTHROID, LEVOTHROID) 75 MCG tablet Take 75 mcg by mouth daily before breakfast.    Historical Provider, MD  LOSARTAN POTASSIUM PO Take 1 tablet by mouth daily.    Historical Provider, MD  losartan-hydrochlorothiazide (HYZAAR) 100-12.5 MG per tablet Take 1 tablet by mouth daily.    Historical Provider, MD  metoprolol succinate (TOPROL-XL) 25 MG 24 hr tablet Take 25 mg by mouth daily.    Historical Provider, MD  simvastatin (ZOCOR)  40 MG tablet Take 40 mg by mouth every evening.    Historical Provider, MD   BP 131/73 mmHg  Pulse 63  Temp(Src) 98.7 F (37.1 C) (Oral)  Resp 18  Ht  (1.753 m)  Wt 200 lb (90.719 kg)  BMI 29.52 kg/m2  SpO2 97% Physical Exam  Constitutional: She is oriented to person, place, and time. She appears well-developed and well-nourished.  HENT:  Head: Normocephalic and atraumatic.  Right Ear: External ear normal.  Left Ear: External ear normal.  Nose: Nose normal.  Mouth/Throat: Oropharynx is clear and moist.  Eyes: Conjunctivae and EOM are normal. Pupils are equal, round, and reactive to light.  Neck:  Normal range of motion. Neck supple.  Cardiovascular: Normal rate, regular rhythm, normal heart sounds and intact distal pulses.   Pulmonary/Chest: Effort normal and breath sounds normal.  Abdominal: Soft. Bowel sounds are normal.  Musculoskeletal: Normal range of motion.  Neurological: She is alert and oriented to person, place, and time.  Skin: Skin is warm and dry.  Nursing note and vitals reviewed.   ED Course  Procedures (including critical care time) Labs Review Labs Reviewed  BASIC METABOLIC PANEL - Abnormal; Notable for the following:    Potassium 3.1 (*)    Glucose, Bld 128 (*)    All other components within normal limits  CBC  TROPONIN I    Imaging Review Dg Chest 2 View  11/12/2015  CLINICAL DATA:  Acute onset of generalized chest pain and heaviness. Diaphoresis, wheezing, palpitations and chest congestion. Initial encounter. EXAM: CHEST  2 VIEW COMPARISON:  Chest radiograph from 08/24/2013 FINDINGS: The lungs are well-aerated and clear. There is no evidence of focal opacification, pleural effusion or pneumothorax. The heart is normal in size; the mediastinal contour is within normal limits. No acute osseous abnormalities are seen. IMPRESSION: No acute cardiopulmonary process seen. Electronically Signed   By: Roanna Raider M.D.   On: 11/12/2015 19:40   I have personally reviewed and evaluated these images and lab results as part of my medical decision-making.   EKG Interpretation   Date/Time:  Saturday Nov 12 2015 18:44:00 EDT Ventricular Rate:  66 PR Interval:  166 QRS Duration: 116 QT Interval:  428 QTC Calculation: 448 R Axis:   -21 Text Interpretation:  Sinus rhythm Incomplete RBBB and LAFB Baseline  wander in lead(s) V3 Confirmed by Sallee Hogrefe MD, Zana Biancardi ([id number]) on 11/12/2015  7:01:48 PM      MDM  PT HAD A NL CATH 1.5 YEARS AGO.  NL TROP WITH ADDN SX OF URI.  OK FOR D/C.  PT KNOWS TO RETURN IF WORSE. Final diagnoses:  Chest pain, unspecified chest pain type         Jacalyn Lefevre, MD 11/12/15 1954

## 2015-11-12 NOTE — Discharge Instructions (Signed)
Chest Wall Pain °Chest wall pain is pain in or around the bones and muscles of your chest. Sometimes, an injury causes this pain. Sometimes, the cause may not be known. This pain may take several weeks or longer to get better. °HOME CARE °Pay attention to any changes in your symptoms. Take these actions to help with your pain: °· Rest as told by your doctor. °· Avoid activities that cause pain. Try not to use your chest, belly (abdominal), or side muscles to lift heavy things. °· If directed, apply ice to the painful area: °¨ Put ice in a plastic bag. °¨ Place a towel between your skin and the bag. °¨ Leave the ice on for 20 minutes, 2-3 times per day. °· Take over-the-counter and prescription medicines only as told by your doctor. °· Do not use tobacco products, including cigarettes, chewing tobacco, and e-cigarettes. If you need help quitting, ask your doctor. °· Keep all follow-up visits as told by your doctor. This is important. °GET HELP IF: °· You have a fever. °· Your chest pain gets worse. °· You have new symptoms. °GET HELP RIGHT AWAY IF: °· You feel sick to your stomach (nauseous) or you throw up (vomit). °· You feel sweaty or light-headed. °· You have a cough with phlegm (sputum) or you cough up blood. °· You are short of breath. °  °This information is not intended to replace advice given to you by your health care provider. Make sure you discuss any questions you have with your health care provider. °  °Document Released: 11/28/2007 Document Revised: 03/02/2015 Document Reviewed: 09/06/2014 °Elsevier Interactive Patient Education ©2016 Elsevier Inc. ° °

## 2015-11-12 NOTE — ED Notes (Signed)
Presents with c/c 'chest heaviness' since yesterday.  States the heaviness came on fairly suddenly while they where installing an [facility] in her home. Today symptoms progressively worsened with sweats, wheezing and chest congestion.

## 2015-11-12 NOTE — ED Notes (Signed)
MD at bedside. 

## 2016-01-19 ENCOUNTER — Other Ambulatory Visit: Payer: Self-pay

## 2016-01-19 DIAGNOSIS — N644 Mastodynia: Secondary | ICD-10-CM

## 2016-02-01 ENCOUNTER — Other Ambulatory Visit: Payer: Self-pay | Admitting: Family Medicine

## 2016-02-01 DIAGNOSIS — N644 Mastodynia: Secondary | ICD-10-CM

## 2016-02-09 ENCOUNTER — Ambulatory Visit
Admission: RE | Admit: 2016-02-09 | Discharge: 2016-02-09 | Disposition: A | Discharge status: Home / Self Care | Payer: PRIVATE HEALTH INSURANCE | Source: Ambulatory Visit | Attending: Family Medicine | Admitting: Family Medicine

## 2016-02-09 ENCOUNTER — Other Ambulatory Visit: Payer: Self-pay

## 2016-02-09 DIAGNOSIS — N644 Mastodynia: Secondary | ICD-10-CM

## 2016-07-03 ENCOUNTER — Encounter: Payer: Self-pay | Admitting: Neurology

## 2016-07-03 ENCOUNTER — Ambulatory Visit (INDEPENDENT_AMBULATORY_CARE_PROVIDER_SITE_OTHER): Payer: BLUE CROSS/BLUE SHIELD | Admitting: Neurology

## 2016-07-03 VITALS — BP 147/87 | HR 75 | Ht 69.0 in | Wt 206.5 lb

## 2016-07-03 DIAGNOSIS — R202 Paresthesia of skin: Secondary | ICD-10-CM | POA: Insufficient documentation

## 2016-07-03 MED ORDER — MELOXICAM 15 MG PO TABS: 15.0000 mg | ORAL_TABLET | Freq: Every day | ORAL | 1 refills | Status: DC

## 2016-07-03 MED ORDER — METHOCARBAMOL 500 MG PO TABS: 500.0000 mg | ORAL_TABLET | Freq: Three times a day (TID) | ORAL | 1 refills | Status: DC

## 2016-07-03 NOTE — Patient Instructions (Signed)
   We will start Mobic as an anti-inflamatory and robaxin as a muscle relaxant. We will get physical therapy set up.   If no improvement is noted in 2-3 weeks, call our office and I will get a MRI of the neck set up.

## 2016-07-03 NOTE — Progress Notes (Signed)
Reason for visit: Right arm paresthesias and pain  Referring physician: Dr. Jairo Rodgers is a 61 y.o. female  History of present illness:  Patricia Rodgers is a 61 year old right-handed black female with a history of onset of right arm symptoms about 3 weeks prior to this evaluation. She does report a chronic history of neck discomfort and shoulder pain, this has worsened recently as well. The patient is now having some discomfort into the neck and shoulder, worse on the right, with numbness from the shoulder level down to the wrist on the right arm. The patient feels more symptoms when she is sitting, she does not notice the numbness as much when she is up walking and doing things. She feels somewhat better when she is lying down. The patient has pulling sensations in the neck when she looks up, she denies any numbness or pain down the right arm with any head or neck movement. She believes that there may be some slight weakness of the right arm. She denies paresthesias down the left arm or with the legs. She does not have any problems controlling the bowels or the bladder and she denies any balance issues. She is sent to this office for further evaluation of the above symptoms.  Past Medical History:  Diagnosis Date  . Breast mass   . Hyperlipemia   . Hypertension   . Internal hemorrhoids   . Thyroid disease     Past Surgical History:  Procedure Laterality Date  . ABDOMINAL HYSTERECTOMY    . APPENDECTOMY    . LEFT HEART CATHETERIZATION WITH CORONARY ANGIOGRAM N/A 04/26/2014   Procedure: LEFT HEART CATHETERIZATION WITH CORONARY ANGIOGRAM;  Surgeon: Pamella Pert, MD;  Location: [facility] CATH LAB;  Service: Cardiovascular;  Laterality: N/A;    Family History  Problem Relation Age of Onset  . Diabetes Mother   . Hypertension Mother   . Heart disease Mother   . Heart disease Father     Social history:  reports that she quit smoking about 20 years ago. She has never used  smokeless tobacco. She reports that she does not drink alcohol or use drugs.  Medications:  Prior to Admission medications   Medication Sig Start Date End Date Taking? Authorizing Provider  aspirin EC 81 MG tablet Take 81 mg by mouth.   Yes Historical Provider, MD  atorvastatin (LIPITOR) 40 MG tablet TAKE ONE TABLET BY MOUTH ONCE DAILY 04/16/16  Yes Historical Provider, MD  cyanocobalamin (TH VITAMIN B12) 100 MCG tablet Take 100 mcg by mouth daily.    Yes Historical Provider, MD  ferrous sulfate 325 (65 FE) MG tablet Take 325 mg by mouth daily.   Yes Historical Provider, MD  levothyroxine (SYNTHROID, LEVOTHROID) 75 MCG tablet Take 75 mcg by mouth daily before breakfast.   Yes Historical Provider, MD  losartan-hydrochlorothiazide (HYZAAR) 100-12.5 MG per tablet Take 1 tablet by mouth daily.   Yes Historical Provider, MD  meloxicam (MOBIC) 15 MG tablet Take 1 tablet (15 mg total) by mouth daily. 07/03/16   York Spaniel, MD  methocarbamol (ROBAXIN) 500 MG tablet Take 1 tablet (500 mg total) by mouth 3 (three) times daily. 07/03/16   York Spaniel, MD     No Known Allergies  ROS:  Out of a complete 14 system review of symptoms, the patient complains only of the following symptoms, and all other reviewed systems are negative.  Feeling hot, cold Numbness, weakness  Blood pressure (!) 147/87, pulse 75,  height 5\' 9"  (1.753 m), weight 206 lb 8 oz (93.7 kg).  Physical Exam  General: The patient is alert and cooperative at the time of the examination.  Eyes: Pupils are equal, round, and reactive to light. Discs are flat bilaterally.  Neck: The neck is supple, no carotid bruits are noted.  Respiratory: The respiratory examination is clear.  Cardiovascular: The cardiovascular examination reveals a regular rate and rhythm, no obvious murmurs or rubs are noted.  Neuromuscular: The patient lacks about 15 of full lateral rotation of the cervical spine bilaterally.  Skin: Extremities are  without significant edema.  Neurologic Exam  Mental status: The patient is alert and oriented x 3 at the time of the examination. The patient has apparent normal recent and remote memory, with an apparently normal attention span and concentration ability.  Cranial nerves: Facial symmetry is present. There is good sensation of the face to pinprick and soft touch bilaterally. The strength of the facial muscles and the muscles to head turning and shoulder shrug are normal bilaterally. Speech is well enunciated, no aphasia or dysarthria is noted. Extraocular movements are full. Visual fields are full. The tongue is midline, and the patient has symmetric elevation of the soft palate. No obvious hearing deficits are noted.  Motor: The motor testing reveals 5 over 5 strength of all 4 extremities. Good symmetric motor tone is noted throughout.  Sensory: Sensory testing is intact to pinprick, soft touch, vibration sensation, and position sense on all 4 extremities. No evidence of extinction is noted.  Coordination: Cerebellar testing reveals good finger-nose-finger and heel-to-shin bilaterally.  Gait and station: Gait is normal. Tandem gait is normal. Romberg is negative. No drift is seen.  Reflexes: Deep tendon reflexes are symmetric and normal bilaterally. Toes are downgoing bilaterally.   Assessment/Plan:  1. Cervical spine pain, right arm pain and paresthesias  The patient may have a low-grade cervical radiculopathy, there is no reflex asymmetry or weakness at this time. The patient may have sensory symptoms down the right arm solely associated with spasm in the neck and shoulder. The patient will be set up for physical therapy on the neck and shoulders, she will be placed on Robaxin and Mobic. She will follow-up in 3-4 months. If she is not getting better in the next 3 weeks, she is to contact our office and we will set up MRI evaluation of the cervical spine.  Marlan Palau. Keith Eleftheria Taborn MD 07/03/2016 3:56  PM  [facility] [address] [place], [place] [id number]  Phone [phone] Fax [phone]

## 2016-07-16 ENCOUNTER — Encounter: Payer: Self-pay | Admitting: Physical Therapy

## 2016-07-16 ENCOUNTER — Ambulatory Visit: Payer: BLUE CROSS/BLUE SHIELD | Attending: Neurology | Admitting: Physical Therapy

## 2016-07-16 DIAGNOSIS — M436 Torticollis: Secondary | ICD-10-CM

## 2016-07-16 DIAGNOSIS — R293 Abnormal posture: Secondary | ICD-10-CM

## 2016-07-16 DIAGNOSIS — M6281 Muscle weakness (generalized): Secondary | ICD-10-CM | POA: Insufficient documentation

## 2016-07-16 DIAGNOSIS — M542 Cervicalgia: Secondary | ICD-10-CM | POA: Diagnosis present

## 2016-07-16 NOTE — Therapy (Signed)
[facility] [address] [place], [place], [address] Phone: [phone]   Fax:  [phone]  Physical Therapy Evaluation  Patient Details  Name: Patricia Rodgers MRN: [id number] Date of Birth: 1956-01-10 Referring Provider: York Spaniel, MD  Encounter Date: 07/16/2016      PT End of Session - 07/16/16 1303    Visit Number 1   Number of Visits 9   Date for PT Re-Evaluation 08/03/16   Authorization Type [employer]   PT Start Time 1213   PT Stop Time 1255   PT Time Calculation (min) 42 min   Activity Tolerance Patient limited by pain   Behavior During Therapy [facility] for tasks assessed/performed      Past Medical History:  Diagnosis Date  . Breast mass   . Hyperlipemia   . Hypertension   . Internal hemorrhoids   . Thyroid disease     Past Surgical History:  Procedure Laterality Date  . ABDOMINAL HYSTERECTOMY    . APPENDECTOMY    . LEFT HEART CATHETERIZATION WITH CORONARY ANGIOGRAM N/A 04/26/2014   Procedure: LEFT HEART CATHETERIZATION WITH CORONARY ANGIOGRAM;  Surgeon: Pamella Pert, MD;  Location: [facility] CATH LAB;  Service: Cardiovascular;  Laterality: N/A;    There were no vitals filed for this visit.       Subjective Assessment - 07/16/16 1217    Subjective This 61yo female reports onset of right arm paresthesia and pain in November. She reports she awoke one morning with weak & numb feeling. She saw her family physician on 05/30/2016 with diagnosis of Cervicalgia & Cervical Radiculopathy with referral to neurologist. She was evaluated on 07/03/2016 by neurologist, Dr. York Spaniel. Dr. Anne Hahn referred her to PT for 3 weeks of care. If she does not respond to PT, then will have a MRI to further assess. Pt presents for PT evaluation.    Limitations Sitting   How long can you sit comfortably? 20-30 minutes   How long can you stand comfortably? 20 minutes then puts hand in pocket for support   Patient Stated  Goals to work & perform activities without arm pain or issues.    Currently in Pain? Yes   Pain Score 7   in last week, worst 9/10, best 0/10   Pain Location Arm   Pain Orientation Right   Pain Descriptors / Indicators Aching;Tingling;Other (Comment)  electrifying   Pain Type Acute pain   Pain Onset 1 to 4 weeks ago   Pain Frequency Intermittent   Aggravating Factors  sitting,    Pain Relieving Factors stretching arm   Effect of Pain on Daily Activities limits sitting            [facility] PT Assessment - 07/16/16 1215      Assessment   Medical Diagnosis parathesia right arm   Referring Provider York Spaniel, MD   Onset Date/Surgical Date --  Nov 2017   Hand Dominance Right   Prior Therapy none     Precautions   Precautions None     Balance Screen   Has the patient fallen in the past 6 months No   Has the patient had a decrease in activity level because of a fear of falling?  No   Is the patient reluctant to leave their home because of a fear of falling?  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     Sensation   Light Touch Appears Intact     Posture/Postural Control   Posture/Postural Control Postural limitations   Postural Limitations Rounded Shoulders;Forward head     Tone   Assessment Location Right Upper Extremity     ROM / Strength   AROM / PROM / Strength PROM;Strength     PROM   Overall PROM  Deficits   Overall PROM Comments right shoulder & elbow WFL   PROM Assessment Site Cervical   Cervical Flexion 44  "feels good"   Cervical Extension 31  pain in posterior, right cervical & upper trapezius   Cervical - Right Side Bend 31  stretch pain left Trapezius   Cervical - Left Side Bend 37  stretch pain right Trapezius   Cervical - Right Rotation 41  "feels good"   Cervical - Left  Rotation 48  stretch pain in right Trapezius     Strength   Overall Strength Deficits   Overall Strength Comments Right shoulder flexion, abduction & elbow flex /ext 5/5   Strength Assessment Site Cervical;Hand   Right/Left hand Right;Left   Right Hand Grip (lbs) 42#  norm for age is 57.3# +/- 12.5#, so 2.8# below range   Left Hand Grip (lbs) 52#  age norm is 4347.3 +/- 11.9#, so on upper side of norm scale   Cervical Flexion 4/5   Cervical Extension 4/5   Cervical - Right Side Bend 4/5   Cervical - Left Side Bend 4/5   Cervical - Right Rotation 4/5   Cervical - Left Rotation 4/5     Palpation   Patella mobility Tightness & tenderness in right upper Trapezius & Rhomboids. No tenderness at rotator cuff muscles.      Special Tests    Special Tests Cervical   Cervical Tests Dictraction;Spurling's;Vertebral Artery Test     Spurling's   Findings Negative     Distraction Test   Findngs Negative     Vertebral Artery Test    Findings Negative     RUE Tone   RUE Tone Within Functional Limits                                PT Long Term Goals - 07/16/16 1309      PT LONG TERM GOAL #1   Title Patient demonstrates understanding of ongoing HEP. (Target Date: 08/03/2016)   Time 3   Period Weeks   Status New     PT LONG TERM GOAL #2   Title Patient verbalizes pain in RUE </= 2/10. (Target Date: 08/03/2016)   Time 3   Period Weeks   Status New     PT LONG TERM GOAL #3   Title RUE grip strength within normal for her age (>45#) (Target Date: 08/03/2016)   Time 3   Period Weeks   Status New     PT LONG TERM GOAL #4   Title Patient verbalizes understanding of ergonomics & posture for work set-up & bed/ sleeping. (Target Date: 08/03/2016)   Time 3   Period Weeks   Status New               Plan - 07/16/16 1304    Clinical Impression Statement This 61yo female has onset of right arm parathesia and weakness in November. 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 Use of computer for >20min brings on symptoms and discomfort. Patient has improper posture including head forward & rounded shoulders which negatively impact cervical issues. Patient has decreased cervical range and weakness. She has impaired grip in dominant UE. She appears would benefit from skilled PT.                          Rehab Potential Good   PT Frequency 3x / week   PT Duration 3 weeks   PT Treatment/Interventions ADLs/Self Care Home Management;Electrical Stimulation;Cryotherapy;Moist Heat;Ultrasound;Traction;Functional mobility training;Therapeutic activities;Therapeutic exercise;Patient/family education;Manual techniques;Passive range of motion   PT Next Visit Plan Manual therapy & modalities for pain. Instruct in HEP for posture & cervical ROM.    Consulted and Agree with Plan of Care Patient      Patient will benefit from skilled therapeutic intervention in order to improve the following deficits and impairments:  Decreased activity tolerance, Decreased range of motion, Decreased strength, Impaired flexibility, Postural dysfunction, Pain, Improper body mechanics  Visit Diagnosis: Cervicalgia  Muscle weakness (generalized)  Stiffness of neck  Abnormal posture     Problem List Patient Active Problem List   Diagnosis Date Noted  . Paresthesia of right upper extremity 07/03/2016  . Angina pectoris (HCC) 04/25/2014    Kira Hartl PT, DPT 07/16/2016, 1:14 PM  [facility] [address] [place], [place], [address] Phone: [phone]   Fax:  [phone]  Name: Ersa Delaney MRN: [id number] Date of Birth: 03-21-1956

## 2016-07-18 ENCOUNTER — Ambulatory Visit: Payer: BLUE CROSS/BLUE SHIELD | Admitting: Physical Therapy

## 2016-07-18 ENCOUNTER — Encounter: Payer: Self-pay | Admitting: Physical Therapy

## 2016-07-18 DIAGNOSIS — M436 Torticollis: Secondary | ICD-10-CM

## 2016-07-18 DIAGNOSIS — M542 Cervicalgia: Secondary | ICD-10-CM | POA: Diagnosis not present

## 2016-07-18 DIAGNOSIS — M6281 Muscle weakness (generalized): Secondary | ICD-10-CM

## 2016-07-18 DIAGNOSIS — R293 Abnormal posture: Secondary | ICD-10-CM

## 2016-07-18 NOTE — Patient Instructions (Addendum)
Pectoralis Stretch: Supine (Towel Roll)    Lie with rolled towel/foam pool noodle under spine, letting shoulders relax toward floor, arms out away from body (not higher that shoulder level). Lie _1-4__ minutes. Do _1-2__ times per day.  http://ss.exer.us/355   Copyright  VHI. All rights reserved.    Roll    Slowly roll shoulder in backward direction, in pain free range. Repeat _10_ times. Do _1-2_ times per day.  Copyright  VHI. All rights reserved.    The Stretch Shown is for the L side, you should perform on both sides.  1) start in a good postured sitting position 2) grasp the chair with the arm on the side to be stretched or place hand under buttocks 3) the grasp is just enough pressure to keep the shoulder blade down 4) turn your head away and slightly downwards into the opposite armpit 5) reach with your R hand to the back L of your head and apply slight overpressure until a gentle stretch is felt  Sleeping on Side    Place pillow between knees. Use cervical support under neck and a roll around waist as needed.   Copyright  VHI. All rights reserved.  Sleeping on Back    Place pillow under knees. A pillow with cervical support and a roll around waist are also helpful.   Copyright  VHI. All rights reserved.

## 2016-07-19 NOTE — Therapy (Signed)
[facility] [address] [place], [place], [address] Phone: [phone]   Fax:  [phone]  Physical Therapy Treatment  Patient Details  Name: Patricia Rodgers MRN: [id number] Date of Birth: 02-10-1956 Referring Provider: York Spaniel, MD  Encounter Date: 07/18/2016      PT End of Session - 07/18/16 1242    Visit Number 2   Number of Visits 9   Date for PT Re-Evaluation 08/03/16   Authorization Type BCBS   PT Start Time 1235   PT Stop Time 1315   PT Time Calculation (min) 40 min   Activity Tolerance Patient limited by pain   Behavior During Therapy [facility] for tasks assessed/performed      Past Medical History:  Diagnosis Date  . Breast mass   . Hyperlipemia   . Hypertension   . Internal hemorrhoids   . Thyroid disease     Past Surgical History:  Procedure Laterality Date  . ABDOMINAL HYSTERECTOMY    . APPENDECTOMY    . LEFT HEART CATHETERIZATION WITH CORONARY ANGIOGRAM N/A 04/26/2014   Procedure: LEFT HEART CATHETERIZATION WITH CORONARY ANGIOGRAM;  Surgeon: Pamella Pert, MD;  Location: [facility] CATH LAB;  Service: Cardiovascular;  Laterality: N/A;    There were no vitals filed for this visit.      Subjective Assessment - 07/18/16 1241    Subjective No new complaints. Still with pain in right UE.   Limitations Sitting   How long can you sit comfortably? 20-30 minutes   How long can you stand comfortably? 20 minutes then puts hand in pocket for support   Patient Stated Goals to work & perform activities without arm pain or issues.    Currently in Pain? Yes   Pain Score 7    Pain Location Arm   Pain Orientation Right   Pain Descriptors / Indicators Aching;Tingling   Pain Type Acute pain   Pain Onset 1 to 4 weeks ago   Pain Frequency Intermittent   Aggravating Factors  prolonged sitting   Pain Relieving Factors stretching arm            [facility] Adult PT Treatment/Exercise - 07/18/16 1243        Manual Therapy   Manual Therapy Myofascial release;Manual Traction;Scapular mobilization;Neural Stretch   Manual therapy comments manual therapy performed in hook lying on mat table for decreased pain, decreased radicular symtoms, and for increased mobility. Pt did report decreased symptoms in arm to none after manual therapy, specifically manual cervical traction.                    Soft tissue mobilization to cervical paraspinals, upper traps, rhomboids, scalenes and STM   Myofascial Release to upper traps, rhomboids    Manual Traction gentle cervical distraction for 30-40 sec's hold x 6 reps; suboccipital release for 15 sec's x 6 reps.    Neural Stretch passive stretching of uppper traps, scalenes and STM bil sides.     issued the following to HEP: Pectoralis Stretch: Supine (Towel Roll)    Lie with rolled towel/foam pool noodle under spine, letting shoulders relax toward floor, arms out away from body (not higher that shoulder level). Lie _1-4__ minutes. Do _1-2__ times per day.  http://ss.exer.us/355   Copyright  VHI. All rights reserved.    Roll    Slowly roll shoulder in backward direction, in pain free range. Repeat _10_ times. Do _1-2_ times per day.  Copyright  VHI. All rights reserved.  The Stretch Shown is for the L side, you should perform on both sides.  1) start in a good postured sitting position 2) grasp the chair with the arm on the side to be stretched or place hand under buttocks 3) the grasp is just enough pressure to keep the shoulder blade down 4) turn your head away and slightly downwards into the opposite armpit 5) reach with your R hand to the back L of your head and apply slight overpressure until a gentle stretch is felt  Sleeping on Side    Place pillow between knees. Use cervical support under neck and a roll around waist as needed.   Copyright  VHI. All rights reserved.  Sleeping on Back    Place pillow under knees. A  pillow with cervical support and a roll around waist are also helpful.   Copyright  VHI. All rights reserved.          PT Education - 07/18/16 1314    Education provided Yes   Education Details HEP for cervical strengthening; sleeping position on side and back   Person(s) Educated Patient   Methods Explanation;Demonstration;Handout;Verbal cues   Comprehension Verbalized understanding;Returned demonstration;Verbal cues required;Need further instruction             PT Long Term Goals - 07/16/16 1309      PT LONG TERM GOAL #1   Title Patient demonstrates understanding of ongoing HEP. (Target Date: 08/03/2016)   Time 3   Period Weeks   Status New     PT LONG TERM GOAL #2   Title Patient verbalizes pain in RUE </= 2/10. (Target Date: 08/03/2016)   Time 3   Period Weeks   Status New     PT LONG TERM GOAL #3   Title RUE grip strength within normal for her age (>45#) (Target Date: 08/03/2016)   Time 3   Period Weeks   Status New     PT LONG TERM GOAL #4   Title Patient verbalizes understanding of ergonomics & posture for work set-up & bed/ sleeping. (Target Date: 08/03/2016)   Time 3   Period Weeks   Status New            Plan - 07/18/16 1243    Clinical Impression Statement Today's skilled session focused on decreasing pain and increasing cervical motions through stretching. Pt reported no UE symptoms post manual therapy today. No issues reported with HEP  that was issued today. Pt is making steady progress toward goals and should benefit from continued PT to progress toward unmet goals.                                       Rehab Potential Good   PT Frequency 3x / week   PT Duration 3 weeks   PT Treatment/Interventions ADLs/Self Care Home Management;Electrical Stimulation;Cryotherapy;Moist Heat;Ultrasound;Traction;Functional mobility training;Therapeutic activities;Therapeutic exercise;Patient/family education;Manual techniques;Passive range of motion   PT Next Visit  Plan Manual therapy & modalities for pain. continued to work on posture and cervical ROM   Consulted and Agree with Plan of Care Patient      Patient will benefit from skilled therapeutic intervention in order to improve the following deficits and impairments:  Decreased activity tolerance, Decreased range of motion, Decreased strength, Impaired flexibility, Postural dysfunction, Pain, Improper body mechanics  Visit Diagnosis: Cervicalgia  Muscle weakness (generalized)  Stiffness of neck  Abnormal posture  Problem List Patient Active Problem List   Diagnosis Date Noted  . Paresthesia of right upper extremity 07/03/2016  . Angina pectoris (HCC) 04/25/2014    Sallyanne Kuster, PTA, [facility] [address] [place], [place] [address] [phone] 07/19/16, 12:01 PM   Name: Patricia Rodgers MRN: [id number] Date of Birth: 1956/05/16

## 2016-07-20 ENCOUNTER — Ambulatory Visit: Payer: BLUE CROSS/BLUE SHIELD | Admitting: Physical Therapy

## 2016-07-23 ENCOUNTER — Ambulatory Visit: Payer: BLUE CROSS/BLUE SHIELD | Admitting: Physical Therapy

## 2016-07-24 ENCOUNTER — Ambulatory Visit: Payer: BLUE CROSS/BLUE SHIELD | Admitting: Physical Therapy

## 2016-07-27 ENCOUNTER — Ambulatory Visit: Payer: BLUE CROSS/BLUE SHIELD | Admitting: Physical Therapy

## 2016-07-31 ENCOUNTER — Ambulatory Visit: Payer: BLUE CROSS/BLUE SHIELD | Admitting: Physical Therapy

## 2016-08-01 ENCOUNTER — Ambulatory Visit: Payer: BLUE CROSS/BLUE SHIELD | Admitting: Physical Therapy

## 2016-08-03 ENCOUNTER — Ambulatory Visit: Payer: BLUE CROSS/BLUE SHIELD | Admitting: Physical Therapy

## 2016-11-29 ENCOUNTER — Other Ambulatory Visit: Payer: Self-pay | Admitting: Family Medicine

## 2016-11-29 DIAGNOSIS — R109 Unspecified abdominal pain: Secondary | ICD-10-CM

## 2017-01-29 ENCOUNTER — Other Ambulatory Visit: Payer: Self-pay | Admitting: Family Medicine

## 2017-01-29 ENCOUNTER — Other Ambulatory Visit: Payer: Self-pay | Admitting: Internal Medicine

## 2017-01-29 DIAGNOSIS — Z1231 Encounter for screening mammogram for malignant neoplasm of breast: Secondary | ICD-10-CM

## 2017-02-11 ENCOUNTER — Ambulatory Visit: Payer: BLUE CROSS/BLUE SHIELD

## 2017-02-20 ENCOUNTER — Ambulatory Visit
Admission: RE | Admit: 2017-02-20 | Discharge: 2017-02-20 | Disposition: A | Discharge status: Home / Self Care | Payer: BLUE CROSS/BLUE SHIELD | Source: Ambulatory Visit | Attending: Internal Medicine | Admitting: Internal Medicine

## 2017-02-20 DIAGNOSIS — Z1231 Encounter for screening mammogram for malignant neoplasm of breast: Secondary | ICD-10-CM

## 2017-02-21 ENCOUNTER — Other Ambulatory Visit: Payer: Self-pay | Admitting: Internal Medicine

## 2017-02-21 DIAGNOSIS — R928 Other abnormal and inconclusive findings on diagnostic imaging of breast: Secondary | ICD-10-CM

## 2017-02-27 ENCOUNTER — Other Ambulatory Visit: Payer: BLUE CROSS/BLUE SHIELD

## 2017-03-01 ENCOUNTER — Other Ambulatory Visit: Payer: Self-pay | Admitting: Internal Medicine

## 2017-03-01 ENCOUNTER — Ambulatory Visit
Admission: RE | Admit: 2017-03-01 | Discharge: 2017-03-01 | Disposition: A | Discharge status: Home / Self Care | Payer: BLUE CROSS/BLUE SHIELD | Source: Ambulatory Visit | Attending: Internal Medicine | Admitting: Internal Medicine

## 2017-03-01 DIAGNOSIS — N6489 Other specified disorders of breast: Secondary | ICD-10-CM

## 2017-03-01 DIAGNOSIS — R928 Other abnormal and inconclusive findings on diagnostic imaging of breast: Secondary | ICD-10-CM

## 2017-03-05 ENCOUNTER — Ambulatory Visit
Admission: RE | Admit: 2017-03-05 | Discharge: 2017-03-05 | Disposition: A | Discharge status: Home / Self Care | Payer: BLUE CROSS/BLUE SHIELD | Source: Ambulatory Visit | Attending: Internal Medicine | Admitting: Internal Medicine

## 2017-03-05 ENCOUNTER — Other Ambulatory Visit: Payer: Self-pay | Admitting: Internal Medicine

## 2017-03-05 DIAGNOSIS — N6489 Other specified disorders of breast: Secondary | ICD-10-CM

## 2017-03-21 ENCOUNTER — Other Ambulatory Visit: Payer: Self-pay | Admitting: Surgery

## 2017-03-21 DIAGNOSIS — N649 Disorder of breast, unspecified: Secondary | ICD-10-CM

## 2017-03-26 ENCOUNTER — Other Ambulatory Visit: Payer: Self-pay | Admitting: Surgery

## 2017-03-26 DIAGNOSIS — N649 Disorder of breast, unspecified: Secondary | ICD-10-CM

## 2017-04-04 ENCOUNTER — Encounter: Payer: Self-pay | Admitting: Physical Therapy

## 2017-04-04 NOTE — Therapy (Signed)
[facility] [address] [place], [place], [address] Phone: [phone]   Fax:  [phone]  Patient Details  Name: Patricia Rodgers MRN: [id number] Date of Birth: Apr 07, 1956 Referring Provider:  Kathrynn Ducking, MD  Encounter Date: 04/04/2017  PHYSICAL THERAPY DISCHARGE SUMMARY  Visits from Start of Care: 2  Current functional level related to goals / functional outcomes: Patient did not return after 2nd visit so unknown level at discharge.    Remaining deficits: Patient did not return after 2nd visit so unknown level at discharge.    Education / Equipment: Initial HEP  Plan: Patient agrees to discharge.  Patient goals were not met. Patient is being discharged due to not returning since the last visit.  ?????         Patricia Rodgers  PT, DPT 04/04/2017, 10:34 AM  [facility] [address] [place], [place], [address] Phone: [phone]   Fax:  [phone]

## 2017-05-07 ENCOUNTER — Encounter (HOSPITAL_BASED_OUTPATIENT_CLINIC_OR_DEPARTMENT_OTHER): Payer: Self-pay | Admitting: *Deleted

## 2017-05-07 ENCOUNTER — Encounter (HOSPITAL_BASED_OUTPATIENT_CLINIC_OR_DEPARTMENT_OTHER)
Admission: RE | Admit: 2017-05-07 | Discharge: 2017-05-07 | Disposition: A | Discharge status: Home / Self Care | Payer: BLUE CROSS/BLUE SHIELD | Source: Ambulatory Visit | Attending: Surgery | Admitting: Surgery

## 2017-05-07 ENCOUNTER — Other Ambulatory Visit: Payer: Self-pay

## 2017-05-07 DIAGNOSIS — Z01810 Encounter for preprocedural cardiovascular examination: Secondary | ICD-10-CM | POA: Insufficient documentation

## 2017-05-07 DIAGNOSIS — E119 Type 2 diabetes mellitus without complications: Secondary | ICD-10-CM | POA: Insufficient documentation

## 2017-05-07 DIAGNOSIS — Z01812 Encounter for preprocedural laboratory examination: Secondary | ICD-10-CM | POA: Insufficient documentation

## 2017-05-07 DIAGNOSIS — R001 Bradycardia, unspecified: Secondary | ICD-10-CM | POA: Insufficient documentation

## 2017-05-07 DIAGNOSIS — I10 Essential (primary) hypertension: Secondary | ICD-10-CM | POA: Insufficient documentation

## 2017-05-07 LAB — BASIC METABOLIC PANEL
Anion gap: 9 (ref 5–15)
BUN: 15 mg/dL (ref 6–20)
CALCIUM: 9.5 mg/dL (ref 8.9–10.3)
CO2: 24 mmol/L (ref 22–32)
Chloride: 102 mmol/L (ref 101–111)
Creatinine, Ser: 0.88 mg/dL (ref 0.44–1.00)
GFR calc Af Amer: >60 mL/min (ref >60)
GLUCOSE: 83 mg/dL (ref 65–99)
POTASSIUM: 3.9 mmol/L (ref 3.5–5.1)
Sodium: 135 mmol/L (ref 135–145)

## 2017-05-07 NOTE — Progress Notes (Signed)
EKG reviewed by Dr. Renold DonGermeroth, will proceed with surgery as scheduled.  Ensure pre surgery drink given with instructions to drink by 0930 dos, pt verbalized understanding.

## 2017-05-10 ENCOUNTER — Ambulatory Visit
Admission: RE | Admit: 2017-05-10 | Discharge: 2017-05-10 | Disposition: A | Discharge status: Home / Self Care | Payer: BLUE CROSS/BLUE SHIELD | Source: Ambulatory Visit | Attending: Surgery | Admitting: Surgery

## 2017-05-10 DIAGNOSIS — N649 Disorder of breast, unspecified: Secondary | ICD-10-CM

## 2017-05-12 NOTE — H&P (Signed)
Patricia CulverMarie I Rodgers  [facility] Patient #: [id number] DOB: [birth year] Separated / Language: [place] / Race: Black or African American Female  History of Present Illness   The patient is a 61 year old female who presents with a complaint of lesion in right breast.  The PCP is Dr. Reece AgarG. Osei-Bonsu.  She come with her son, [name], and DIL, [name]  She gets annual mammograms. She's had no prior breast biopsy. She had a partial hysterectomy in 2012, but this was for benign disease. Her gynecologist was Dr. Mila PalmerE Green. She is not on hormone replacement therapy. She has no family history of breast cancer. She had mammograms at the breast center on 20 February 2017 which saw some distortion in the right breast. Repeat mammograms on 01 March 2017 showed distortion in the upper outer quadrant of the right breast. She underwent a right breast biopsy on 05 March 2017 ([id number]) which showed a CSL.  I gave her a copy of the path report. I gave her a booklet on breast biopsies. I explained the indications and risk of breast biopsy. The risks include bleeding, infection, and nerve injury. And if there is a cancer, she may require additional surgery. She is going on a cruise at the end of October and wanted to wait after she got back from the surgery. This should be okay.  Past Medical History: 1. HTN 2. Hypothryoid 3. History of appendectomy - at age 61 yo 4. Back pain - arthritis 5. MRI of her back suggested that she has one small kidney stone. She has never had symptoms.  Social History: Separated. Has 2 sons She come with her son, [name], and DIL, [name] Works accounts payable for [employer]  Past Surgical History ([place]; 03/21/2017 10:26 AM) Appendectomy  Breast Biopsy  Right. Hysterectomy (not due to cancer) - Partial   Diagnostic Studies History (April Staton, CMA; 03/21/2017 10:26 AM) Colonoscopy  5-10 years  ago Mammogram  within last year  Allergies (April Staton, CMA; 03/21/2017 10:28 AM) No Known Drug Allergies 03/21/2017  Medication History (April Staton, CMA; 03/21/2017 10:31 AM) Aspirin (81MG  Tablet, Oral) Active. Atorvastatin Calcium (40MG  Tablet, Oral) Active. Vitamin B12 (100MCG Tablet, Oral) Active. Iron (Ferrous Gluconate) (325MG  Tablet, Oral) Active. Levothyroxine Sodium (75MCG Tablet, Oral) Active. Losartan Potassium-HCTZ (100-12.5MG  Tablet, Oral) Active. Meloxicam (15MG  Tablet, Oral) Active. Robaxin (500MG  Tablet, Oral) Active. MetFORMIN HCl (Oral) Specific strength unknown - Active. Medications Reconciled  Social History ([place]; 03/21/2017 10:26 AM) Alcohol use  Remotely quit alcohol use. Tobacco use  Former smoker.  Family History ([place]; 03/21/2017 10:26 AM) Diabetes Mellitus  Mother. Heart Disease  Father. Hypertension  Mother.  Pregnancy / Birth History (April Joana ReamerStaton, [place]; 03/21/2017 10:26 AM) Age at menarche  13 years. Age of menopause  8756-60 Gravida  4 Irregular periods  Maternal age  60-25 Para  2  Other Problems (April Staton, CMA; 03/21/2017 10:26 AM) Thyroid Disease     Review of Systems (April Staton CMA; 03/21/2017 10:26 AM) General Not Present- Appetite Loss, Chills, Fatigue, Fever, Night Sweats, Weight Gain and Weight Loss. Skin Not Present- Change in Wart/Mole, Dryness, Hives, Jaundice, New Lesions, Non-Healing Wounds, Rash and Ulcer. HEENT Not Present- Earache, Hearing Loss, Hoarseness, Nose Bleed, Oral Ulcers, Ringing in the Ears, Seasonal Allergies, Sinus Pain, Sore Throat, Visual Disturbances, Wears glasses/contact lenses and Yellow Eyes. Respiratory Not Present- Bloody sputum, Chronic Cough, Difficulty Breathing, Snoring and Wheezing. Breast Not Present- Breast Mass, Breast Pain, Nipple Discharge and  Skin Changes. Cardiovascular Not Present- Chest Pain, Difficulty Breathing Lying Down, Leg Cramps,  Palpitations, Rapid Heart Rate, Shortness of Breath and Swelling of Extremities. Gastrointestinal Not Present- Abdominal Pain, Bloating, Bloody Stool, Change in Bowel Habits, Chronic diarrhea, Constipation, Difficulty Swallowing, Excessive gas, Gets full quickly at meals, Hemorrhoids, Indigestion, Nausea, Rectal Pain and Vomiting. Female Genitourinary Not Present- Frequency, Nocturia, Painful Urination, Pelvic Pain and Urgency. Musculoskeletal Not Present- Back Pain, Joint Pain, Joint Stiffness, Muscle Pain, Muscle Weakness and Swelling of Extremities. Neurological Not Present- Decreased Memory, Fainting, Headaches, Numbness, Seizures, Tingling, Tremor, Trouble walking and Weakness. Psychiatric Not Present- Anxiety, Bipolar, Change in Sleep Pattern, Depression, Fearful and Frequent crying. Endocrine Present- Hot flashes and New Diabetes. Not Present- Cold Intolerance, Excessive Hunger, Hair Changes and Heat Intolerance. Hematology Not Present- Blood Thinners, Easy Bruising, Excessive bleeding, Gland problems, HIV and Persistent Infections.  Vitals (April Staton CMA; 03/21/2017 10:31 AM) 03/21/2017 10:31 AM Weight: 201.38 lb Height: 69in Body Surface Area: 2.07 m Body Mass Index: 29.74 kg/m  Temp.: 98.1F(Oral)  Pulse: 56 (Regular)  BP: 138/88 (Sitting, Left Arm, Standard)     Physical Exam  General: WN AA F alert and generally healthy appearing. HEENT: Normal. Pupils equal.  Neck: Supple. No mass. No thyroid mass.  Lymph Nodes: No supraclavicular or cervical nodes.  Lungs: Clear to auscultation and symmetric breath sounds. Heart: RRR. No murmur or rub.  Breast: Right - Large and pendulous breast. Still has steristrips - I remvoed them. She has a little mass effect in the UOQ of the right breast, but I think that this is from the biopsy. Left - Large and pendulous breast. No mass.  Abdomen: Soft. No mass. No tenderness. No hernia. Normal bowel sounds.   Lower midlein scar from appendectomy at age 89  Extremities: Good strength and ROM in upper and lower extremities.  Neurologic: Grossly intact to motor and sensory function. Psychiatric: Has normal mood and affect. Behavior is normal.   Assessment & Plan  1.  BREAST LESION (N64.9)  History - Right breast biopsy on 05 March 2017 ([id number]) which showed a CSL.  Plan   1) Right breast biopsy with seed localization  2. HTN 3. Hypothryoid 4. History of appendectomy - at age 61 yo 5. Back pain - arthritis 6. MRI of her back suggested that she has one small kidney stone. She has never had symptoms.   Ovidio Kinavid Letina Luckett, MD, [facility] Pager: [phone] Office phone:  [phone]

## 2017-05-13 ENCOUNTER — Other Ambulatory Visit: Payer: Self-pay

## 2017-05-13 ENCOUNTER — Encounter (HOSPITAL_BASED_OUTPATIENT_CLINIC_OR_DEPARTMENT_OTHER): Payer: Self-pay | Admitting: Certified Registered"

## 2017-05-13 ENCOUNTER — Encounter (HOSPITAL_BASED_OUTPATIENT_CLINIC_OR_DEPARTMENT_OTHER)
Admission: RE | Disposition: A | Discharge status: Home / Self Care | Payer: Self-pay | Source: Ambulatory Visit | Attending: Surgery

## 2017-05-13 ENCOUNTER — Ambulatory Visit (HOSPITAL_BASED_OUTPATIENT_CLINIC_OR_DEPARTMENT_OTHER): Payer: BLUE CROSS/BLUE SHIELD | Admitting: Anesthesiology

## 2017-05-13 ENCOUNTER — Ambulatory Visit
Admission: RE | Admit: 2017-05-13 | Discharge: 2017-05-13 | Disposition: A | Discharge status: Home / Self Care | Payer: BLUE CROSS/BLUE SHIELD | Source: Ambulatory Visit | Attending: Surgery | Admitting: Surgery

## 2017-05-13 ENCOUNTER — Ambulatory Visit (HOSPITAL_BASED_OUTPATIENT_CLINIC_OR_DEPARTMENT_OTHER)
Admission: RE | Admit: 2017-05-13 | Discharge: 2017-05-13 | Disposition: A | Discharge status: Home / Self Care | Payer: BLUE CROSS/BLUE SHIELD | Source: Ambulatory Visit | Attending: Surgery | Admitting: Surgery

## 2017-05-13 DIAGNOSIS — I10 Essential (primary) hypertension: Secondary | ICD-10-CM | POA: Insufficient documentation

## 2017-05-13 DIAGNOSIS — N6489 Other specified disorders of breast: Secondary | ICD-10-CM | POA: Diagnosis not present

## 2017-05-13 DIAGNOSIS — M1990 Unspecified osteoarthritis, unspecified site: Secondary | ICD-10-CM | POA: Insufficient documentation

## 2017-05-13 DIAGNOSIS — Z79899 Other long term (current) drug therapy: Secondary | ICD-10-CM | POA: Diagnosis not present

## 2017-05-13 DIAGNOSIS — Z794 Long term (current) use of insulin: Secondary | ICD-10-CM | POA: Insufficient documentation

## 2017-05-13 DIAGNOSIS — E119 Type 2 diabetes mellitus without complications: Secondary | ICD-10-CM | POA: Insufficient documentation

## 2017-05-13 DIAGNOSIS — Z87891 Personal history of nicotine dependence: Secondary | ICD-10-CM | POA: Insufficient documentation

## 2017-05-13 DIAGNOSIS — N6021 Fibroadenosis of right breast: Secondary | ICD-10-CM | POA: Insufficient documentation

## 2017-05-13 DIAGNOSIS — E785 Hyperlipidemia, unspecified: Secondary | ICD-10-CM | POA: Diagnosis not present

## 2017-05-13 DIAGNOSIS — Z7982 Long term (current) use of aspirin: Secondary | ICD-10-CM | POA: Insufficient documentation

## 2017-05-13 DIAGNOSIS — N649 Disorder of breast, unspecified: Secondary | ICD-10-CM

## 2017-05-13 DIAGNOSIS — E039 Hypothyroidism, unspecified: Secondary | ICD-10-CM | POA: Insufficient documentation

## 2017-05-13 HISTORY — DX: Hypothyroidism, unspecified: E03.9

## 2017-05-13 HISTORY — DX: Type 2 diabetes mellitus without complications: E11.9

## 2017-05-13 HISTORY — PX: RADIOACTIVE SEED GUIDED EXCISIONAL BREAST BIOPSY: SHX6490

## 2017-05-13 LAB — POCT HEMOGLOBIN-HEMACUE: HEMOGLOBIN: 12.7 g/dL (ref 12.0–15.0)

## 2017-05-13 LAB — GLUCOSE, CAPILLARY
GLUCOSE-CAPILLARY: 85 mg/dL (ref 65–99)
Glucose-Capillary: 163 mg/dL — ABNORMAL HIGH (ref 65–99)

## 2017-05-13 SURGERY — RADIOACTIVE SEED GUIDED BREAST BIOPSY
Anesthesia: General | Site: Breast | Laterality: Right

## 2017-05-13 MED ORDER — ONDANSETRON HCL 4 MG/2ML IJ SOLN
INTRAMUSCULAR | Status: AC
  Filled 2017-05-13: qty 2

## 2017-05-13 MED ORDER — LIDOCAINE HCL (CARDIAC) 20 MG/ML IV SOLN
INTRAVENOUS | Status: DC | PRN
  Administered 2017-05-13: 60 mg via INTRAVENOUS

## 2017-05-13 MED ORDER — CHLORHEXIDINE GLUCONATE CLOTH 2 % EX PADS: 6.0000 | MEDICATED_PAD | Freq: Once | CUTANEOUS | Status: DC

## 2017-05-13 MED ORDER — LACTATED RINGERS IV SOLN
INTRAVENOUS | Status: DC
  Administered 2017-05-13 (×2): via INTRAVENOUS

## 2017-05-13 MED ORDER — SCOPOLAMINE 1 MG/3DAYS TD PT72: 1.0000 | MEDICATED_PATCH | Freq: Once | TRANSDERMAL | Status: DC | PRN

## 2017-05-13 MED ORDER — LIDOCAINE 2% (20 MG/ML) 5 ML SYRINGE
INTRAMUSCULAR | Status: AC
  Filled 2017-05-13: qty 5

## 2017-05-13 MED ORDER — MIDAZOLAM HCL 2 MG/2ML IJ SOLN
INTRAMUSCULAR | Status: AC
  Filled 2017-05-13: qty 2

## 2017-05-13 MED ORDER — HYDROCODONE-ACETAMINOPHEN 7.5-325 MG PO TABS: 1.0000 | ORAL_TABLET | Freq: Once | ORAL | Status: DC | PRN

## 2017-05-13 MED ORDER — METOCLOPRAMIDE HCL 5 MG/ML IJ SOLN
10.0000 mg | Freq: Once | INTRAMUSCULAR | Status: AC | PRN
  Administered 2017-05-13: 10 mg via INTRAVENOUS

## 2017-05-13 MED ORDER — PROPOFOL 10 MG/ML IV BOLUS
INTRAVENOUS | Status: DC | PRN
  Administered 2017-05-13: 150 mg via INTRAVENOUS

## 2017-05-13 MED ORDER — METOCLOPRAMIDE HCL 5 MG/ML IJ SOLN
INTRAMUSCULAR | Status: AC
  Filled 2017-05-13: qty 2

## 2017-05-13 MED ORDER — BUPIVACAINE-EPINEPHRINE (PF) 0.25% -1:200000 IJ SOLN
INTRAMUSCULAR | Status: DC | PRN
  Administered 2017-05-13: 30 mL via PERINEURAL

## 2017-05-13 MED ORDER — ONDANSETRON HCL 4 MG/2ML IJ SOLN
INTRAMUSCULAR | Status: DC | PRN
  Administered 2017-05-13: 4 mg via INTRAVENOUS

## 2017-05-13 MED ORDER — DEXAMETHASONE SODIUM PHOSPHATE 4 MG/ML IJ SOLN
INTRAMUSCULAR | Status: DC | PRN
  Administered 2017-05-13: 4 mg via INTRAVENOUS

## 2017-05-13 MED ORDER — FENTANYL CITRATE (PF) 100 MCG/2ML IJ SOLN
INTRAMUSCULAR | Status: AC
  Filled 2017-05-13: qty 2

## 2017-05-13 MED ORDER — ACETAMINOPHEN 500 MG PO TABS
1000.0000 mg | ORAL_TABLET | ORAL | Status: AC
  Administered 2017-05-13: 1000 mg via ORAL

## 2017-05-13 MED ORDER — HYDROCODONE-ACETAMINOPHEN 5-325 MG PO TABS: 1.0000 | ORAL_TABLET | Freq: Four times a day (QID) | ORAL | 0 refills | Status: DC | PRN

## 2017-05-13 MED ORDER — DEXAMETHASONE SODIUM PHOSPHATE 10 MG/ML IJ SOLN
INTRAMUSCULAR | Status: AC
  Filled 2017-05-13: qty 1

## 2017-05-13 MED ORDER — GABAPENTIN 300 MG PO CAPS
300.0000 mg | ORAL_CAPSULE | ORAL | Status: AC
  Administered 2017-05-13: 300 mg via ORAL

## 2017-05-13 MED ORDER — PROPOFOL 10 MG/ML IV BOLUS
INTRAVENOUS | Status: AC
  Filled 2017-05-13: qty 20

## 2017-05-13 MED ORDER — FENTANYL CITRATE (PF) 100 MCG/2ML IJ SOLN
25.0000 ug | INTRAMUSCULAR | Status: DC | PRN
Start: 2017-05-13 — End: 2017-05-13
  Administered 2017-05-13: 50 ug via INTRAVENOUS

## 2017-05-13 MED ORDER — EPHEDRINE SULFATE 50 MG/ML IJ SOLN
INTRAMUSCULAR | Status: DC | PRN
  Administered 2017-05-13 (×2): 10 mg via INTRAVENOUS

## 2017-05-13 MED ORDER — ACETAMINOPHEN 500 MG PO TABS
ORAL_TABLET | ORAL | Status: AC
  Filled 2017-05-13: qty 2

## 2017-05-13 MED ORDER — MIDAZOLAM HCL 2 MG/2ML IJ SOLN
1.0000 mg | INTRAMUSCULAR | Status: DC | PRN
  Administered 2017-05-13: 2 mg via INTRAVENOUS

## 2017-05-13 MED ORDER — GABAPENTIN 300 MG PO CAPS
ORAL_CAPSULE | ORAL | Status: AC
  Filled 2017-05-13: qty 1

## 2017-05-13 MED ORDER — CEFAZOLIN SODIUM-DEXTROSE 2-4 GM/100ML-% IV SOLN
2.0000 g | INTRAVENOUS | Status: AC
  Administered 2017-05-13: 2 g via INTRAVENOUS

## 2017-05-13 MED ORDER — MEPERIDINE HCL 25 MG/ML IJ SOLN: 6.2500 mg | INTRAMUSCULAR | Status: DC | PRN

## 2017-05-13 MED ORDER — FENTANYL CITRATE (PF) 100 MCG/2ML IJ SOLN
50.0000 ug | INTRAMUSCULAR | Status: DC | PRN
  Administered 2017-05-13 (×2): 50 ug via INTRAVENOUS

## 2017-05-13 SURGICAL SUPPLY — 58 items
BENZOIN TINCTURE PRP APPL 2/3 (GAUZE/BANDAGES/DRESSINGS) IMPLANT
BINDER BREAST LRG (GAUZE/BANDAGES/DRESSINGS) IMPLANT
BINDER BREAST MEDIUM (GAUZE/BANDAGES/DRESSINGS) IMPLANT
BINDER BREAST XLRG (GAUZE/BANDAGES/DRESSINGS) IMPLANT
BINDER BREAST XXLRG (GAUZE/BANDAGES/DRESSINGS) ×3 IMPLANT
BLADE HEX COATED 2.75 (ELECTRODE) ×3 IMPLANT
BLADE SURG 10 STRL SS (BLADE) ×3 IMPLANT
BLADE SURG 15 STRL LF DISP TIS (BLADE) ×1 IMPLANT
BLADE SURG 15 STRL SS (BLADE) ×2
CANISTER SUC SOCK COL 7IN (MISCELLANEOUS) IMPLANT
CANISTER SUCT 1200ML W/VALVE (MISCELLANEOUS) ×3 IMPLANT
CHLORAPREP W/TINT 26ML (MISCELLANEOUS) ×3 IMPLANT
CLIP VESOCCLUDE SM WIDE 6/CT (CLIP) IMPLANT
CLOSURE WOUND 1/4X4 (GAUZE/BANDAGES/DRESSINGS)
COVER BACK TABLE 60X90IN (DRAPES) ×3 IMPLANT
COVER MAYO STAND STRL (DRAPES) ×3 IMPLANT
COVER PROBE W GEL 5X96 (DRAPES) ×3 IMPLANT
DECANTER SPIKE VIAL GLASS SM (MISCELLANEOUS) IMPLANT
DERMABOND ADVANCED (GAUZE/BANDAGES/DRESSINGS) ×2
DERMABOND ADVANCED .7 DNX12 (GAUZE/BANDAGES/DRESSINGS) ×1 IMPLANT
DEVICE DUBIN W/COMP PLATE 8390 (MISCELLANEOUS) ×6 IMPLANT
DRAPE LAPAROSCOPIC ABDOMINAL (DRAPES) ×3 IMPLANT
DRAPE UTILITY XL STRL (DRAPES) ×3 IMPLANT
DRSG PAD ABDOMINAL 8X10 ST (GAUZE/BANDAGES/DRESSINGS) IMPLANT
ELECT COATED BLADE 2.86 ST (ELECTRODE) ×3 IMPLANT
ELECT REM PT RETURN 9FT ADLT (ELECTROSURGICAL) ×3
ELECTRODE REM PT RTRN 9FT ADLT (ELECTROSURGICAL) ×1 IMPLANT
GAUZE SPONGE 4X4 12PLY STRL LF (GAUZE/BANDAGES/DRESSINGS) IMPLANT
GLOVE BIO SURGEON STRL SZ 6.5 (GLOVE) ×2 IMPLANT
GLOVE BIO SURGEONS STRL SZ 6.5 (GLOVE) ×1
GLOVE BIOGEL M STRL SZ7.5 (GLOVE) ×3 IMPLANT
GLOVE BIOGEL PI IND STRL 8 (GLOVE) ×1 IMPLANT
GLOVE BIOGEL PI INDICATOR 8 (GLOVE) ×2
GLOVE SURG SIGNA 7.5 PF LTX (GLOVE) ×6 IMPLANT
GOWN STRL REUS W/ TWL LRG LVL3 (GOWN DISPOSABLE) ×1 IMPLANT
GOWN STRL REUS W/ TWL XL LVL3 (GOWN DISPOSABLE) ×2 IMPLANT
GOWN STRL REUS W/TWL LRG LVL3 (GOWN DISPOSABLE) ×2
GOWN STRL REUS W/TWL XL LVL3 (GOWN DISPOSABLE) ×4
ILLUMINATOR WAVEGUIDE N/F (MISCELLANEOUS) IMPLANT
KIT MARKER MARGIN INK (KITS) ×3 IMPLANT
LIGHT WAVEGUIDE WIDE FLAT (MISCELLANEOUS) IMPLANT
NEEDLE HYPO 25X1 1.5 SAFETY (NEEDLE) ×3 IMPLANT
NS IRRIG 1000ML POUR BTL (IV SOLUTION) IMPLANT
PACK BASIN DAY SURGERY FS (CUSTOM PROCEDURE TRAY) ×3 IMPLANT
PENCIL BUTTON HOLSTER BLD 10FT (ELECTRODE) ×3 IMPLANT
PIN SAFETY STERILE (MISCELLANEOUS) IMPLANT
SHEET MEDIUM DRAPE 40X70 STRL (DRAPES) IMPLANT
SLEEVE SCD COMPRESS KNEE MED (MISCELLANEOUS) ×3 IMPLANT
SPONGE LAP 18X18 X RAY DECT (DISPOSABLE) ×3 IMPLANT
STRIP CLOSURE SKIN 1/4X4 (GAUZE/BANDAGES/DRESSINGS) IMPLANT
SUT MNCRL AB 4-0 PS2 18 (SUTURE) ×3 IMPLANT
SUT VICRYL 3-0 CR8 SH (SUTURE) ×3 IMPLANT
SYR CONTROL 10ML LL (SYRINGE) ×3 IMPLANT
TOWEL OR 17X24 6PK STRL BLUE (TOWEL DISPOSABLE) ×3 IMPLANT
TOWEL OR NON WOVEN STRL DISP B (DISPOSABLE) IMPLANT
TUBE CONNECTING 20'X1/4 (TUBING)
TUBE CONNECTING 20X1/4 (TUBING) IMPLANT
YANKAUER SUCT BULB TIP NO VENT (SUCTIONS) ×3 IMPLANT

## 2017-05-13 NOTE — Anesthesia Procedure Notes (Signed)
Procedure Name: LMA Insertion Date/Time: 05/13/2017 2:28 PM Performed by: Sheryn BisonBlocker, Maccoy Haubner D, CRNA Pre-anesthesia Checklist: Patient identified, Emergency Drugs available, Suction available and Patient being monitored Patient Re-evaluated:Patient Re-evaluated prior to induction Oxygen Delivery Method: Circle system utilized Preoxygenation: Pre-oxygenation with 100% oxygen Induction Type: IV induction Ventilation: Mask ventilation without difficulty LMA: LMA inserted LMA Size: 4.0 Number of attempts: 1 Airway Equipment and Method: Bite block Placement Confirmation: positive ETCO2 Tube secured with: Tape Dental Injury: Teeth and Oropharynx as per pre-operative assessment

## 2017-05-13 NOTE — Anesthesia Procedure Notes (Signed)
Performed by: Trell Secrist D, CRNA       

## 2017-05-13 NOTE — Anesthesia Preprocedure Evaluation (Addendum)
Anesthesia Evaluation  Patient identified by MRN, date of birth, ID band Patient awake    Reviewed: Allergy & Precautions, NPO status , Patient's Chart, lab work & pertinent test results  Airway Mallampati: II  TM Distance: >3 FB Neck ROM: Full    Dental  (+) Partial Upper, Dental Advisory Given   Pulmonary former smoker,    Pulmonary exam normal breath sounds clear to auscultation       Cardiovascular hypertension, Pt. on medications + angina Normal cardiovascular exam Rhythm:Regular Rate:Normal     Neuro/Psych negative neurological ROS  negative psych ROS   GI/Hepatic negative GI ROS, Neg liver ROS,   Endo/Other  diabetes, Well Controlled, Type 2, Oral Hypoglycemic AgentsHypothyroidism Right Breast mass ( complex sclerosing lesion) Hyperlipidemia  Renal/GU negative Renal ROS  negative genitourinary   Musculoskeletal negative musculoskeletal ROS (+)   Abdominal   Peds  Hematology   Anesthesia Other Findings   Reproductive/Obstetrics                           Anesthesia Physical Anesthesia Plan  ASA: II  Anesthesia Plan: General   Post-op Pain Management:    Induction: Intravenous  PONV Risk Score and Plan: 3 and Midazolam, Ondansetron, Dexamethasone and Treatment may vary due to age or medical condition  Airway Management Planned: LMA  Additional Equipment:   Intra-op Plan:   Post-operative Plan: Extubation in OR  Informed Consent: I have reviewed the patients History and Physical, chart, labs and discussed the procedure including the risks, benefits and alternatives for the proposed anesthesia with the patient or authorized representative who has indicated his/her understanding and acceptance.   Dental advisory given  Plan Discussed with: Anesthesiologist, CRNA and Surgeon  Anesthesia Plan Comments:         Anesthesia Quick Evaluation

## 2017-05-13 NOTE — Transfer of Care (Signed)
Immediate Anesthesia Transfer of Care Note  Patient: Patricia Rodgers  Procedure(s) Performed: RIGHT RADIOACTIVE SEED GUIDED EXCISIONAL BREAST BIOPSY ERAS PATHWAY (Right Breast)  Patient Location: PACU  Anesthesia Type:General  Level of Consciousness: awake, alert  and oriented  Airway & Oxygen Therapy: Patient Spontanous Breathing and Patient connected to face mask oxygen  Post-op Assessment: Report given to RN and Post -op Vital signs reviewed and stable  Post vital signs: Reviewed and stable  Last Vitals:  Vitals:   05/13/17 1305  BP: 129/72  Pulse: (!) 50  Resp: 18  Temp: (!) 36.4 C  SpO2: 100%    Last Pain:  Vitals:   05/13/17 1305  TempSrc: Oral         Complications: No apparent anesthesia complications

## 2017-05-13 NOTE — Op Note (Signed)
05/13/2017  3:43 PM  PATIENT:  Patricia Rodgers DOB: 07/13/1955 MRN: [id number]  PREOP DIAGNOSIS:   Complex sclerosing lesion of right breast  POSTOP DIAGNOSIS:    Complex sclerosing lesion of right breast, 11 o'clock position   PROCEDURE:   Procedure(s): RIGHT RADIOACTIVE SEED GUIDED EXCISIONAL BREAST BIOPSY   SURGEON:   Alphonsa Overall, M.D.  ANESTHESIA:   general  Anesthesiologist: Josephine Igo, MD CRNA: Blocker, Ernesta Amble, CRNA; Genelle Bal, CRNA  General  EBL:  minimal  ml  DRAINS:  none   LOCAL MEDICATIONS USED:   30 cc 1/4% marcaine  SPECIMEN:   Left breast biopsy (painted with 6 colors, contained the radioactive seed),  Lateral margin (contained the radiologic clip)  COUNTS CORRECT:  YES  INDICATIONS FOR PROCEDURE:  Patricia Rodgers is a 61 y.o. (DOB: 19-Oct-1955) AA female whose primary care physician is Osei-Bonsu, Iona Beard, MD and comes for right breast lumpectomy.   She underwent a right breast biopsy on 05 March 2017 ([id number]) which showed a CSL.   The indications and potential complications of surgery were explained to the patient. Potential complications include, but are not limited to, bleeding, infection, the need for further surgery, and nerve injury.     She had a I131 seed placed on 05/10/2017 in her right breast at [facility].   OPERATIVE NOTE:   The patient was taken to room # 7 at [facility] where she underwent a general anesthesia  supervised by Anesthesiologist: Josephine Igo, MD CRNA: Blocker, Ernesta Amble, CRNA; Genelle Bal, CRNA. Her right breast and axilla were prepped with  ChloraPrep and sterilely draped.    A time-out and the surgical check list was reviewed.    I started the right breast biopsy by localizing the seed in the UOQ.   I used the Neoprobe to identify the I131 seed.  I tried to excise an area around the marker of at least 1 cm.    I excised this block of breast tissue approximately 3 cm by  5 cm  in diameter.   I painted the lumpectomy specimen with the 6 color paint kit and did a specimen mammogram which confirmed  the seed was in the specimen.  But not the clip.  I excised the lateral margin of the biopsy cavity, there area that I suspected was closest to the clip.  I x-rayed the lateral margin and the radiologic clip was in the specimen.   So I had both the clip and the seed. The specimen was sent to pathology who called back to confirm that they have the seed and the specimen.  I then irrigated the wound with saline. I infiltrated approximately 30 mL of 1/4% Marcaine between the incisions.   I then closed the wound in layers using 3-0 Vicryl sutures for the deep layer. At the skin, I closed the incisions with a 4-0 Monocryl suture. The incisions were then painted with Dermabond.  She had gauze place over the wounds and placed in a breast binder.   The patient tolerated the procedure well, was transported to the recovery room in good condition. Sponge and needle count were correct at the end of the case.   Final pathology is pending.   Alphonsa Overall, MD, [facility] Pager: [phone] Office phone:  [phone]

## 2017-05-13 NOTE — Anesthesia Postprocedure Evaluation (Signed)
Anesthesia Post Note  Patient: Patricia AlexandersMarie Rodgers  Procedure(s) Performed: RIGHT RADIOACTIVE SEED GUIDED EXCISIONAL BREAST BIOPSY ERAS PATHWAY (Right Breast)     Patient location during evaluation: PACU Anesthesia Type: General Level of consciousness: awake and alert and oriented Pain management: pain level controlled Vital Signs Assessment: post-procedure vital signs reviewed and stable Respiratory status: spontaneous breathing, nonlabored ventilation and respiratory function stable Cardiovascular status: blood pressure returned to baseline and stable Postop Assessment: no apparent nausea or vomiting Anesthetic complications: no    Last Vitals:  Vitals:   05/13/17 1600 05/13/17 1615  BP: 135/75 (!) 141/75  Pulse: 85 70  Resp: 12 17  Temp:    SpO2: 97% 100%    Last Pain:  Vitals:   05/13/17 1615  TempSrc:   PainSc: 0-No pain                 Kourtnee Lahey A.

## 2017-05-13 NOTE — Discharge Instructions (Signed)
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 Allene PyoNewman's office [facility] at [phone] for an appointment in 2 to 3 weeks.  Medications and dosages:  Resume your home medications.  You have a prescription for:  vicodin  Call Dr. Ezzard StandingNewman or his office  [phone]) if you have:  Temperature greater than 100.4,  Persistent nausea and vomiting,  Severe uncontrolled pain,  Redness, tenderness, or signs of infection (pain, swelling, redness, odor or green/yellow discharge around the site),  Difficulty breathing, headache or visual disturbances,  Any other questions or concerns you may have after discharge.  In an emergency, call 911 or go to an Emergency Department at a nearby hospital.    ROS       Post Anesthesia Home Care Instructions  Activity: Get plenty of rest for the remainder of the day. A responsible individual must stay with you for 24 hours following the procedure.  For the next 24 hours, DO NOT: -Drive a car -Advertising copywriterperate machinery -Drink alcoholic beverages -Take any medication unless instructed by your physician -Make any legal decisions or sign important papers.  Meals: Start with liquid foods such as gelatin or soup. Progress to regular foods as tolerated. Avoid greasy, spicy, heavy foods. If nausea and/or vomiting occur, drink only clear liquids until the nausea and/or vomiting subsides. Call your physician if vomiting continues.  Special Instructions/Symptoms: Your throat may feel dry or sore from the anesthesia or the breathing tube placed in your throat during surgery. If this causes discomfort, gargle with  warm salt water. The discomfort should disappear within 24 hours.  If you had a scopolamine patch placed behind your ear for the management of post- operative nausea and/or vomiting:  1. The medication in the patch is effective for 72 hours, after which it should be removed.  Wrap patch in a tissue and discard in the trash. Wash hands thoroughly with soap and water. 2. You may remove the patch earlier than 72 hours if you experience unpleasant side effects which may include dry mouth, dizziness or visual disturbances. 3. Avoid touching the patch. Wash your hands with soap and water after contact with the patch.

## 2017-05-13 NOTE — Interval H&P Note (Signed)
History and Physical Interval Note:  05/13/2017 2:16 PM  Patricia Rodgers  has presented today for surgery, with the diagnosis of Complex sclerosing lesion of right breast  The various methods of treatment have been discussed with the patient and family.  Both sons are with her.  After consideration of risks, benefits and other options for treatment, the patient has consented to  Procedure(s): RIGHT RADIOACTIVE SEED GUIDED EXCISIONAL BREAST BIOPSY ERAS PATHWAY (Right) as a surgical intervention .  The patient's history has been reviewed, patient examined, no change in status, stable for surgery.  I have reviewed the patient's chart and labs.  Questions were answered to the patient's satisfaction.     Kandis Cockingavid H Sloane Palmer

## 2017-05-14 ENCOUNTER — Encounter (HOSPITAL_BASED_OUTPATIENT_CLINIC_OR_DEPARTMENT_OTHER): Payer: Self-pay | Admitting: Surgery

## 2017-06-25 HISTORY — PX: BREAST EXCISIONAL BIOPSY: SUR124

## 2017-08-09 NOTE — ED Notes (Signed)
Nurse first-pt NAD-steady gait-O2 sat 100%- P58- [id number]

## 2018-01-25 ENCOUNTER — Emergency Department (HOSPITAL_BASED_OUTPATIENT_CLINIC_OR_DEPARTMENT_OTHER): Payer: BLUE CROSS/BLUE SHIELD

## 2018-01-25 ENCOUNTER — Emergency Department (HOSPITAL_BASED_OUTPATIENT_CLINIC_OR_DEPARTMENT_OTHER)
Admission: EM | Admit: 2018-01-25 | Discharge: 2018-01-25 | Disposition: A | Discharge status: Home / Self Care | Payer: BLUE CROSS/BLUE SHIELD | Attending: Emergency Medicine | Admitting: Emergency Medicine

## 2018-01-25 ENCOUNTER — Other Ambulatory Visit: Payer: Self-pay

## 2018-01-25 ENCOUNTER — Encounter (HOSPITAL_BASED_OUTPATIENT_CLINIC_OR_DEPARTMENT_OTHER): Payer: Self-pay | Admitting: Emergency Medicine

## 2018-01-25 DIAGNOSIS — Z7982 Long term (current) use of aspirin: Secondary | ICD-10-CM | POA: Insufficient documentation

## 2018-01-25 DIAGNOSIS — Z79899 Other long term (current) drug therapy: Secondary | ICD-10-CM | POA: Diagnosis not present

## 2018-01-25 DIAGNOSIS — Z7984 Long term (current) use of oral hypoglycemic drugs: Secondary | ICD-10-CM | POA: Insufficient documentation

## 2018-01-25 DIAGNOSIS — E119 Type 2 diabetes mellitus without complications: Secondary | ICD-10-CM | POA: Insufficient documentation

## 2018-01-25 DIAGNOSIS — I10 Essential (primary) hypertension: Secondary | ICD-10-CM | POA: Insufficient documentation

## 2018-01-25 DIAGNOSIS — M2550 Pain in unspecified joint: Secondary | ICD-10-CM | POA: Insufficient documentation

## 2018-01-25 DIAGNOSIS — R0789 Other chest pain: Secondary | ICD-10-CM | POA: Diagnosis not present

## 2018-01-25 DIAGNOSIS — R51 Headache: Secondary | ICD-10-CM | POA: Insufficient documentation

## 2018-01-25 DIAGNOSIS — E039 Hypothyroidism, unspecified: Secondary | ICD-10-CM | POA: Diagnosis not present

## 2018-01-25 DIAGNOSIS — Z87891 Personal history of nicotine dependence: Secondary | ICD-10-CM | POA: Diagnosis not present

## 2018-01-25 DIAGNOSIS — R519 Headache, unspecified: Secondary | ICD-10-CM

## 2018-01-25 LAB — CBC WITH DIFFERENTIAL/PLATELET
Basophils Absolute: 0 10*3/uL (ref 0.0–0.1)
Basophils Relative: 1 %
Eosinophils Absolute: 0.4 10*3/uL (ref 0.0–0.7)
Eosinophils Relative: 7 %
HCT: 40.7 % (ref 36.0–46.0)
Hemoglobin: 13.8 g/dL (ref 12.0–15.0)
Lymphocytes Relative: 28 %
Lymphs Abs: 1.5 10*3/uL (ref 0.7–4.0)
MCH: 30.1 pg (ref 26.0–34.0)
MCHC: 33.9 g/dL (ref 30.0–36.0)
MCV: 88.7 fL (ref 78.0–100.0)
Monocytes Absolute: 0.4 10*3/uL (ref 0.1–1.0)
Monocytes Relative: 7 %
Neutro Abs: 3 10*3/uL (ref 1.7–7.7)
Neutrophils Relative %: 57 %
Platelets: 234 10*3/uL (ref 150–400)
RBC: 4.59 MIL/uL (ref 3.87–5.11)
RDW: 14.1 % (ref 11.5–15.5)
WBC: 5.2 10*3/uL (ref 4.0–10.5)

## 2018-01-25 LAB — COMPREHENSIVE METABOLIC PANEL
ALT: 21 U/L (ref 0–44)
AST: 26 U/L (ref 15–41)
Albumin: 4.6 g/dL (ref 3.5–5.0)
Alkaline Phosphatase: 39 U/L (ref 38–126)
Anion gap: 9 (ref 5–15)
BUN: 26 mg/dL — ABNORMAL HIGH (ref 6–20)
CO2: 27 mmol/L (ref 22–32)
Calcium: 9.9 mg/dL (ref 8.9–10.3)
Chloride: 102 mmol/L (ref 98–111)
Creatinine, Ser: 1.16 mg/dL — ABNORMAL HIGH (ref 0.44–1.00)
GFR calc Af Amer: 58 mL/min — ABNORMAL LOW (ref >60)
GFR calc non Af Amer: 50 mL/min — ABNORMAL LOW (ref >60)
Glucose, Bld: 96 mg/dL (ref 70–99)
Potassium: 3.4 mmol/L — ABNORMAL LOW (ref 3.5–5.1)
Sodium: 138 mmol/L (ref 135–145)
Total Bilirubin: 0.5 mg/dL (ref 0.3–1.2)
Total Protein: 7.4 g/dL (ref 6.5–8.1)

## 2018-01-25 LAB — URINALYSIS, ROUTINE W REFLEX MICROSCOPIC
Bilirubin Urine: NEGATIVE
Glucose, UA: NEGATIVE mg/dL
Hgb urine dipstick: NEGATIVE
Ketones, ur: NEGATIVE mg/dL
Nitrite: NEGATIVE
Protein, ur: NEGATIVE mg/dL
Specific Gravity, Urine: 1.01 (ref 1.005–1.030)
pH: 7 (ref 5.0–8.0)

## 2018-01-25 LAB — URINALYSIS, MICROSCOPIC (REFLEX)

## 2018-01-25 LAB — TROPONIN I: Troponin I: <0.03 ng/mL (ref <0.03)

## 2018-01-25 MED ORDER — KETOROLAC TROMETHAMINE 30 MG/ML IJ SOLN
15.0000 mg | Freq: Once | INTRAMUSCULAR | Status: AC
  Administered 2018-01-25: 15 mg via INTRAVENOUS
  Filled 2018-01-25: qty 1

## 2018-01-25 MED ORDER — METOCLOPRAMIDE HCL 5 MG/ML IJ SOLN
10.0000 mg | Freq: Once | INTRAMUSCULAR | Status: AC
  Administered 2018-01-25: 10 mg via INTRAVENOUS
  Filled 2018-01-25: qty 2

## 2018-01-25 MED ORDER — SODIUM CHLORIDE 0.9 % IV BOLUS
1000.0000 mL | Freq: Once | INTRAVENOUS | Status: AC
  Administered 2018-01-25: 1000 mL via INTRAVENOUS

## 2018-01-25 MED ORDER — DOXYCYCLINE HYCLATE 100 MG PO CAPS: 100.0000 mg | ORAL_CAPSULE | Freq: Two times a day (BID) | ORAL | 0 refills | Status: DC

## 2018-01-25 NOTE — ED Provider Notes (Signed)
[facility] EMERGENCY DEPARTMENT Provider Note   CSN: [id number] Arrival date & time: 01/25/18  1756     History   Chief Complaint Chief Complaint  Patient presents with  . Headache    HPI Patricia Rodgers is a 62 y.o. female with history of diabetes, hypertension, hyperlipidemia, hypothyroidism who presents with a 3-day history of intermittent headache.  She has had associated neck pain and body aches and now joint pain.  She is also reports chest heaviness and episode of shortness of breath yesterday.  She reports finding a bump on the back of her neck which she applied alcohol and has had improvement.  She thinks she may have been bitten by something, but did not see any insect on her.  She reports cutting the grass prior.  She reports possibility of tick exposure, however no visualized tick.  She denies any fevers at home.  She describes her headache is bilateral temporal.  She denies any vision changes, photophobia, phonophobia.  She does not have a history of headaches.  HPI  Past Medical History:  Diagnosis Date  . Breast mass   . Diabetes mellitus without complication (HCC)   . Hyperlipemia   . Hypertension   . Hypothyroidism   . Internal hemorrhoids   . Thyroid disease     Patient Active Problem List   Diagnosis Date Noted  . Paresthesia of right upper extremity 07/03/2016  . Angina pectoris (HCC) 04/25/2014    Past Surgical History:  Procedure Laterality Date  . ABDOMINAL HYSTERECTOMY    . APPENDECTOMY    . DILATION AND CURETTAGE OF UTERUS    . LEFT HEART CATHETERIZATION WITH CORONARY ANGIOGRAM N/A 04/26/2014   Procedure: LEFT HEART CATHETERIZATION WITH CORONARY ANGIOGRAM;  Surgeon: Pamella Pert, MD;  Location: [facility] CATH LAB;  Service: Cardiovascular;  Laterality: N/A;  . RADIOACTIVE SEED GUIDED EXCISIONAL BREAST BIOPSY Right 05/13/2017   Procedure: RIGHT RADIOACTIVE SEED GUIDED EXCISIONAL BREAST BIOPSY ERAS PATHWAY;  Surgeon: Ovidio Kin, MD;   Location: [facility];  Service: General;  Laterality: Right;     OB History    Gravida  4   Para  2   Term  2   Preterm      AB  2   Living  2     SAB      TAB  2   Ectopic      Multiple      Live Births               Home Medications    Prior to Admission medications   Medication Sig Start Date End Date Taking? Authorizing Provider  atorvastatin (LIPITOR) 40 MG tablet Take by mouth. 01/14/17  Yes [provider]  levothyroxine (SYNTHROID, LEVOTHROID) 75 MCG tablet Take by mouth. 10/17/16  Yes [provider]  losartan-hydrochlorothiazide (HYZAAR) 100-12.5 MG tablet TAKE ONE TABLET BY MOUTH ONCE DAILY 04/11/17  Yes [provider]  aspirin EC 81 MG tablet Take 81 mg by mouth.    [provider]  aspirin EC 81 MG tablet Take by mouth.    [provider]  atorvastatin (LIPITOR) 40 MG tablet TAKE ONE TABLET BY MOUTH ONCE DAILY 04/16/16   [provider]  cyanocobalamin (TH VITAMIN B12) 100 MCG tablet Take 100 mcg by mouth daily.     [provider]  doxycycline (VIBRAMYCIN) 100 MG capsule Take 1 capsule (100 mg total) by mouth 2 (two) times daily. 01/25/18   Breah Joa,  Waylan BogaAlexandra M, PA-C  ferrous sulfate 325 (65 FE) MG tablet Take 325 mg by mouth daily.    [provider]  HYDROcodone-acetaminophen (NORCO/VICODIN) 5-325 MG tablet Take 1-2 tablets every 6 (six) hours as needed by mouth for moderate pain. 05/13/17   Ovidio KinNewman, David, MD  levothyroxine (SYNTHROID, LEVOTHROID) 75 MCG tablet Take 75 mcg by mouth daily before breakfast.    [provider]  losartan-hydrochlorothiazide (HYZAAR) 100-12.5 MG per tablet Take 1 tablet by mouth daily.    [provider]  metFORMIN (GLUCOPHAGE) 1000 MG tablet Take 1,000 mg 2 (two) times daily with a meal by mouth.    [provider]  vitamin C (ASCORBIC ACID) 500 MG tablet Take 500 mg daily by mouth.    [provider]    Vitamin D, Cholecalciferol, 1000 units CAPS Take by mouth.    [provider]    Family History Family History  Problem Relation Age of Onset  . Diabetes Mother   . Hypertension Mother   . Heart disease Mother   . Heart disease Father     Social History Social History   Tobacco Use  . Smoking status: Former Smoker    Last attempt to quit: 1998    Years since quitting: 21.6  . Smokeless tobacco: Never Used  Substance Use Topics  . Alcohol use: No    Alcohol/week: 0.0 oz    Comment: Quit 1992  . Drug use: No    Comment: Quit 1990     Allergies   Patient has no known allergies.   Review of Systems Review of Systems  Constitutional: Negative for chills and fever.  HENT: Negative for facial swelling and sore throat.   Eyes: Negative for photophobia and visual disturbance.  Respiratory: Positive for shortness of breath.   Cardiovascular: Positive for chest pain.  Gastrointestinal: Positive for nausea. Negative for abdominal pain and vomiting.  Genitourinary: Positive for frequency. Negative for dysuria.  Musculoskeletal: Positive for myalgias and neck pain. Negative for back pain and neck stiffness.  Skin: Negative for rash and wound.  Neurological: Positive for headaches.  Psychiatric/Behavioral: The patient is not nervous/anxious.      Physical Exam Updated Vital Signs BP 123/72   Pulse (!) 57   Temp 98.6 F (37 C) (Oral)   Resp 19   Ht 5\' 9"  (1.753 m)   Wt 86.2 kg (190 lb)   SpO2 98%   BMI 28.06 kg/m   Physical Exam  Constitutional: She appears well-developed and well-nourished. No distress.  HENT:  Head: Normocephalic and atraumatic.  Mouth/Throat: Oropharynx is clear and moist. No oropharyngeal exudate.  Eyes: Pupils are equal, round, and reactive to light. Conjunctivae and EOM are normal. Right eye exhibits no discharge. Left eye exhibits no discharge. No scleral icterus.  Neck: Normal range of motion. Neck supple. No neck rigidity. No  thyromegaly present.  Cardiovascular: Normal rate, regular rhythm, normal heart sounds and intact distal pulses. Exam reveals no gallop and no friction rub.  No murmur heard. Pulmonary/Chest: Effort normal and breath sounds normal. No stridor. No respiratory distress. She has no wheezes. She has no rales.  Abdominal: Soft. Bowel sounds are normal. She exhibits no distension. There is no tenderness. There is no rebound and no guarding.  Musculoskeletal: She exhibits no edema.  Tenderness to palpation to upper trapezius muscles  Lymphadenopathy:    She has no cervical adenopathy.  Neurological: She is alert. Coordination normal. GCS eye subscore is 4. GCS verbal subscore is  5. GCS motor subscore is 6.  CN 3-12 intact; normal sensation throughout; 5/5 strength in all 4 extremities; equal bilateral grip strength; no ataxia on finger-to-nose  Skin: Skin is warm and dry. No rash noted. She is not diaphoretic. No pallor.  No lesions noted to the neck  Psychiatric: She has a normal mood and affect.  Nursing note and vitals reviewed.    ED Treatments / Results  Labs (all labs ordered are listed, but only abnormal results are displayed) Labs Reviewed  COMPREHENSIVE METABOLIC PANEL - Abnormal; Notable for the following components:      Result Value   Potassium 3.4 (*)    BUN 26 (*)    Creatinine, Ser 1.16 (*)    GFR calc non Af Amer 50 (*)    GFR calc Af Amer 58 (*)    All other components within normal limits  URINALYSIS, ROUTINE W REFLEX MICROSCOPIC - Abnormal; Notable for the following components:   Leukocytes, UA SMALL (*)    All other components within normal limits  URINALYSIS, MICROSCOPIC (REFLEX) - Abnormal; Notable for the following components:   Bacteria, UA MANY (*)    All other components within normal limits  URINE CULTURE  CBC WITH DIFFERENTIAL/PLATELET  TROPONIN I    EKG EKG Interpretation  Date/Time:  Saturday January 25 2018 19:32:18 EDT Ventricular Rate:  53 PR  Interval:    QRS Duration: 114 QT Interval:  424 QTC Calculation: 398 R Axis:   -30 Text Interpretation:  Sinus rhythm Multiple premature complexes, vent & supraven Borderline IVCD with LAD Abnormal R-wave progression, early transition Borderline T wave abnormalities PVC new since previous Confirmed by Richardean Canal [phone]) on 01/25/2018 8:13:08 PM Also confirmed by Richardean Canal [phone]), editor Sheppard Evens ([id number])  on 01/26/2018 10:59:36 AM   Radiology Dg Chest 2 View  Result Date: 01/25/2018 CLINICAL DATA:  Mid chest pain since yesterday. EXAM: CHEST - 2 VIEW COMPARISON:  None. FINDINGS: The heart size and mediastinal contours are within normal limits. Mild-to-moderate aortic atherosclerosis at the arch without aneurysmal dilatation. Both lungs are clear. The visualized skeletal structures are unremarkable. IMPRESSION: No active cardiopulmonary disease.  Aortic atherosclerosis. Electronically Signed   By: Tollie Eth M.D.   On: 01/25/2018 20:26   Ct Head Wo Contrast  Result Date: 01/25/2018 CLINICAL DATA:  Acute headache, pain down neck into back, bug bite, diabetes mellitus, hypertension EXAM: CT HEAD WITHOUT CONTRAST TECHNIQUE: Contiguous axial images were obtained from the base of the skull through the vertex without intravenous contrast. Sagittal and coronal MPR images reconstructed from axial data set. COMPARISON:  None FINDINGS: Brain: Normal ventricular morphology. No midline shift or mass effect. Normal appearance of brain parenchyma. No intracranial hemorrhage, mass lesion, evidence of acute infarction, or extra-axial fluid collection. Vascular: No hyperdense vessels Skull: Intact Sinuses/Orbits: Clear Other: N/A IMPRESSION: Normal exam. Electronically Signed   By: Ulyses Southward M.D.   On: 01/25/2018 20:50    Procedures Procedures (including critical care time)  Medications Ordered in ED Medications  metoCLOPramide (REGLAN) injection 10 mg (10 mg Intravenous Given 01/25/18 1956)    ketorolac (TORADOL) 30 MG/ML injection 15 mg (15 mg Intravenous Given 01/25/18 1956)  sodium chloride 0.9 % bolus 1,000 mL (0 mLs Intravenous Stopped 01/25/18 2210)     Initial Impression / Assessment and Plan / ED Course  I have reviewed the triage vital signs and the nursing notes.  Pertinent labs & imaging results that were available during my care of  the patient were reviewed by me and considered in my medical decision making (see chart for details).     Patient presenting with headache, body aches, polyarthralgia after believing she may have been bitten by something.  Headache is relieved with Toradol, Reglan, fluids in the ED.  Normal neuro exam without focal deficits.  Full range of motion of the neck.  Patient is afebrile.  Low suspicion for meningitis or other emergent finding.  CBC unremarkable.  CMP shows BUN 26, creatinine 1.16.  Patient given 1 L fluids prior to discharge.  Troponin negative.  EKG shows NSR with new PVC, otherwise similar to her last tracing.  UA shows small leukocytes, many bacteria.  Urine culture sent and would treat if positive.  CT head is negative.  Chest x-ray is negative.  Will treat for suspected tickborne illness with doxycycline.  Patient advised to follow-up with her PCP in 2 to 3 days.  Strict return precautions given.  Patient understands and agrees with plan.  Patient vitals stable here ED course and discharged in satisfactory condition. I discussed patient case with Dr. Silverio Lay who guided the patient's management and agrees with plan.   Final Clinical Impressions(s) / ED Diagnoses   Final diagnoses:  Bad headache  Polyarthralgia    ED Discharge Orders        Ordered    doxycycline (VIBRAMYCIN) 100 MG capsule  2 times daily     01/25/18 2148       Emi Holes, PA-C 01/26/18 1358    Charlynne Pander, MD 01/26/18 1504

## 2018-01-25 NOTE — ED Notes (Signed)
Back from xray, alert, NAD, calm, interactive. HA/ pain improved, [facility], pt updated.

## 2018-01-25 NOTE — ED Triage Notes (Signed)
Patient states that she has a headache " and I normally do not get headaches" - patient states that she was bit by a bug on the back of her neck about 1 week ago  - since she started to have a headache and "yucky feeling" - she reports generalized aches and nausea

## 2018-01-25 NOTE — ED Notes (Signed)
Alert, NAD, calm, interactive, resps e/u, speaking in clear complete sentences, no dyspnea noted, skin W&D, VSS, c/o HA and neck pain, (denies: other pain, fever, cough, sob, NVD, dizziness or visual changes).

## 2018-01-25 NOTE — Discharge Instructions (Signed)
Take doxycycline until completed for possible tickborne illness.  Other than mild dehydration some bacteria in your urine, your work-up was unremarkable today.  Your urine will be sent for culture and you will be called if additional antibiotic treatment is needed.  Please follow-up with your doctor in 3 to 4 days for recheck.  Please return to the emergency department if you develop any new or worsening symptoms.

## 2018-01-27 LAB — URINE CULTURE

## 2018-07-15 ENCOUNTER — Other Ambulatory Visit: Payer: Self-pay | Admitting: Internal Medicine

## 2018-07-15 DIAGNOSIS — Z1231 Encounter for screening mammogram for malignant neoplasm of breast: Secondary | ICD-10-CM

## 2018-08-06 ENCOUNTER — Emergency Department (HOSPITAL_BASED_OUTPATIENT_CLINIC_OR_DEPARTMENT_OTHER)
Admission: EM | Admit: 2018-08-06 | Discharge: 2018-08-06 | Disposition: A | Discharge status: Home / Self Care | Payer: BLUE CROSS/BLUE SHIELD | Attending: Emergency Medicine | Admitting: Emergency Medicine

## 2018-08-06 ENCOUNTER — Emergency Department (HOSPITAL_BASED_OUTPATIENT_CLINIC_OR_DEPARTMENT_OTHER): Payer: BLUE CROSS/BLUE SHIELD

## 2018-08-06 ENCOUNTER — Other Ambulatory Visit: Payer: Self-pay

## 2018-08-06 ENCOUNTER — Encounter (HOSPITAL_BASED_OUTPATIENT_CLINIC_OR_DEPARTMENT_OTHER): Payer: Self-pay | Admitting: Emergency Medicine

## 2018-08-06 DIAGNOSIS — E119 Type 2 diabetes mellitus without complications: Secondary | ICD-10-CM | POA: Diagnosis not present

## 2018-08-06 DIAGNOSIS — R0789 Other chest pain: Secondary | ICD-10-CM

## 2018-08-06 DIAGNOSIS — Z7982 Long term (current) use of aspirin: Secondary | ICD-10-CM | POA: Diagnosis not present

## 2018-08-06 DIAGNOSIS — E039 Hypothyroidism, unspecified: Secondary | ICD-10-CM | POA: Diagnosis not present

## 2018-08-06 DIAGNOSIS — Z7984 Long term (current) use of oral hypoglycemic drugs: Secondary | ICD-10-CM | POA: Diagnosis not present

## 2018-08-06 DIAGNOSIS — K440 Diaphragmatic hernia with obstruction, without gangrene: Secondary | ICD-10-CM | POA: Insufficient documentation

## 2018-08-06 DIAGNOSIS — I10 Essential (primary) hypertension: Secondary | ICD-10-CM | POA: Insufficient documentation

## 2018-08-06 DIAGNOSIS — Z79899 Other long term (current) drug therapy: Secondary | ICD-10-CM | POA: Insufficient documentation

## 2018-08-06 DIAGNOSIS — Z87891 Personal history of nicotine dependence: Secondary | ICD-10-CM | POA: Insufficient documentation

## 2018-08-06 DIAGNOSIS — R079 Chest pain, unspecified: Secondary | ICD-10-CM | POA: Diagnosis present

## 2018-08-06 LAB — POCT I-STAT EG7
ACID-BASE EXCESS: 4 mmol/L — AB (ref 0.0–2.0)
BICARBONATE: 29.9 mmol/L — AB (ref 20.0–28.0)
Calcium, Ion: 1.24 mmol/L (ref 1.15–1.40)
HEMATOCRIT: 44 % (ref 36.0–46.0)
HEMOGLOBIN: 15 g/dL (ref 12.0–15.0)
O2 SAT: 67 %
Potassium: 3.2 mmol/L — ABNORMAL LOW (ref 3.5–5.1)
Sodium: 142 mmol/L (ref 135–145)
TCO2: 31 mmol/L (ref 22–32)
pCO2, Ven: 47 mmHg (ref 44.0–60.0)
pH, Ven: 7.411 (ref 7.250–7.430)
pO2, Ven: 35 mmHg (ref 32.0–45.0)

## 2018-08-06 LAB — BASIC METABOLIC PANEL
Anion gap: 12 (ref 5–15)
BUN: 14 mg/dL (ref 8–23)
CHLORIDE: 103 mmol/L (ref 98–111)
CO2: 26 mmol/L (ref 22–32)
Calcium: 9.9 mg/dL (ref 8.9–10.3)
Creatinine, Ser: 1.11 mg/dL — ABNORMAL HIGH (ref 0.44–1.00)
GFR calc non Af Amer: 54 mL/min — ABNORMAL LOW (ref >60)
Glucose, Bld: 135 mg/dL — ABNORMAL HIGH (ref 70–99)
POTASSIUM: 3.3 mmol/L — AB (ref 3.5–5.1)
SODIUM: 141 mmol/L (ref 135–145)

## 2018-08-06 LAB — TROPONIN I
Troponin I: <0.03 ng/mL (ref <0.03)
Troponin I: <0.03 ng/mL (ref <0.03)

## 2018-08-06 LAB — CBC
HEMATOCRIT: 46.3 % — AB (ref 36.0–46.0)
HEMOGLOBIN: 14.8 g/dL (ref 12.0–15.0)
MCH: 28.2 pg (ref 26.0–34.0)
MCHC: 32 g/dL (ref 30.0–36.0)
MCV: 88.2 fL (ref 80.0–100.0)
Platelets: 250 10*3/uL (ref 150–400)
RBC: 5.25 MIL/uL — AB (ref 3.87–5.11)
RDW: 13.5 % (ref 11.5–15.5)
WBC: 4.2 10*3/uL (ref 4.0–10.5)
nRBC: 0 % (ref 0.0–0.2)

## 2018-08-06 LAB — MAGNESIUM: Magnesium: 2.3 mg/dL (ref 1.7–2.4)

## 2018-08-06 MED ORDER — SODIUM CHLORIDE 0.9% FLUSH
3.0000 mL | Freq: Once | INTRAVENOUS | Status: DC
  Filled 2018-08-06: qty 3

## 2018-08-06 MED ORDER — IOPAMIDOL (ISOVUE-370) INJECTION 76%
100.0000 mL | Freq: Once | INTRAVENOUS | Status: AC | PRN
  Administered 2018-08-06: 100 mL via INTRAVENOUS

## 2018-08-06 MED ORDER — SODIUM CHLORIDE 0.9 % IV BOLUS
500.0000 mL | Freq: Once | INTRAVENOUS | Status: AC
  Administered 2018-08-06: 500 mL via INTRAVENOUS

## 2018-08-06 NOTE — ED Notes (Signed)
Lab made aware of Mg add on.

## 2018-08-06 NOTE — ED Notes (Signed)
Patient transported to CT 

## 2018-08-06 NOTE — ED Notes (Signed)
Pt verbalizes understanding of d/c instructions and denies any further need at this time. 

## 2018-08-06 NOTE — ED Notes (Signed)
C/o chest heaviness onset yesterday  And today  Slight cough and congestion

## 2018-08-06 NOTE — Discharge Instructions (Addendum)
You have been diagnosed today with Atypical Chest Pain.   At this time there does not appear to be the presence of an emergent medical condition, however there is always the potential for conditions to change. Please read and follow the below instructions.  Please return to the Emergency Department immediately for any new or worsening symptoms. Please be sure to follow up with your Primary Care Provider this week regarding your visit today; please call their office to schedule an appointment even if you are feeling better for a follow-up visit. There was a small spot on the head of your pancreas is seen on CT scan today.  We strongly recommend that you have an MRI of your abdomen performed for further evaluation of this area.  Please call your primary care provider's office today to schedule an appointment and schedule your MRI sometime within the next week. Additionally we recommend that you follow-up with cardiology group for your visit today.  Your CT scan also showed 1 vessel coronary atherosclerosis.  Please call the heart specialist at [facility] heart care cardiovascular division today to schedule your appointment for sometime within the next week to see them.  Get help right away if: Your chest pain gets worse. You have a cough that gets worse, or you cough up blood. You have severe pain in your abdomen. You faint. You have sudden, unexplained chest discomfort. You have sudden, unexplained discomfort in your arms, back, neck, or jaw. You have shortness of breath at any time. You suddenly start to sweat, or your skin gets clammy. You feel nausea or you vomit. You suddenly feel lightheaded or dizzy. You have severe weakness, or unexplained weakness or fatigue. Your heart begins to beat quickly, or it feels like it is skipping beats. You have Fever Any other new or worsening symptoms.  Please read the additional information packets attached to your discharge summary.  Do not take  your medicine if  develop an itchy rash, swelling in your mouth or lips, or difficulty breathing.

## 2018-08-06 NOTE — ED Provider Notes (Signed)
[facility] EMERGENCY DEPARTMENT Provider Note   CSN: [id number] Arrival date & time: 08/06/18  [id number]     History   Chief Complaint Chief Complaint  Patient presents with  . Chest Pain    HPI Patricia Rodgers is a 63 y.o. female presenting today for upper back and chest pain.  Patient reports that yesterday afternoon she developed a mid upper back pain that has been constant since onset she describes it as a moderate intensity ache worsened with exertion and certain movements and improved with rest, patient states that her upper back pain occasionally radiates down to her lower back.  Patient states that later on that night she developed a chest pain which she describes as a substernal heaviness constant without aggravating or alleviating factors.  She states that her chest heaviness that is mild.  She denies radiation of this chest heaviness and states that she has never experienced similar in the past.   Patient reports that this morning she developed rhinorrhea and mild nonproductive cough.  She is concerned for "walking pneumonia" which is why she presented today.  Patient with history of hypertension, hyperlipidemia, diabetes.  HPI  Past Medical History:  Diagnosis Date  . Breast mass   . Diabetes mellitus without complication (HCC)   . Hyperlipemia   . Hypertension   . Hypothyroidism   . Internal hemorrhoids   . Thyroid disease     Patient Active Problem List   Diagnosis Date Noted  . Paresthesia of right upper extremity 07/03/2016  . Angina pectoris (HCC) 04/25/2014    Past Surgical History:  Procedure Laterality Date  . ABDOMINAL HYSTERECTOMY    . APPENDECTOMY    . DILATION AND CURETTAGE OF UTERUS    . LEFT HEART CATHETERIZATION WITH CORONARY ANGIOGRAM N/A 04/26/2014   Procedure: LEFT HEART CATHETERIZATION WITH CORONARY ANGIOGRAM;  Surgeon: Pamella PertJagadeesh R Ganji, MD;  [facility];  Service: Cardiovascular;  Laterality: N/A;  . RADIOACTIVE  SEED GUIDED EXCISIONAL BREAST BIOPSY Right 05/13/2017   Procedure: RIGHT RADIOACTIVE SEED GUIDED EXCISIONAL BREAST BIOPSY ERAS PATHWAY;  Surgeon: Ovidio KinNewman, David, MD;  Location: [facility];  Service: General;  Laterality: Right;     OB History    Gravida  4   Para  2   Term  2   Preterm      AB  2   Living  2     SAB      TAB  2   Ectopic      Multiple      Live Births               Home Medications    Prior to Admission medications   Medication Sig Start Date End Date Taking? Authorizing Provider  aspirin EC 81 MG tablet Take 81 mg by mouth.    [provider]  aspirin EC 81 MG tablet Take by mouth.    [provider]  atorvastatin (LIPITOR) 40 MG tablet TAKE ONE TABLET BY MOUTH ONCE DAILY 04/16/16   [provider]  atorvastatin (LIPITOR) 40 MG tablet Take by mouth. 01/14/17   [provider]  cyanocobalamin (TH VITAMIN B12) 100 MCG tablet Take 100 mcg by mouth daily.     [provider]  doxycycline (VIBRAMYCIN) 100 MG capsule Take 1 capsule (100 mg total) by mouth 2 (two) times daily. 01/25/18   Law, Waylan BogaAlexandra M, PA-C  ferrous sulfate 325 (65 FE) MG tablet Take 325 mg by mouth daily.  [provider]  HYDROcodone-acetaminophen (NORCO/VICODIN) 5-325 MG tablet Take 1-2 tablets every 6 (six) hours as needed by mouth for moderate pain. 05/13/17   Ovidio KinNewman, David, MD  levothyroxine (SYNTHROID, LEVOTHROID) 75 MCG tablet Take 75 mcg by mouth daily before breakfast.    [provider]  levothyroxine (SYNTHROID, LEVOTHROID) 75 MCG tablet Take by mouth. 10/17/16   [provider]  losartan-hydrochlorothiazide (HYZAAR) 100-12.5 MG per tablet Take 1 tablet by mouth daily.    [provider]  losartan-hydrochlorothiazide (HYZAAR) 100-12.5 MG tablet TAKE ONE TABLET BY MOUTH ONCE DAILY 04/11/17   [provider]  metFORMIN (GLUCOPHAGE) 1000 MG tablet Take 1,000 mg 2 (two) times  daily with a meal by mouth.    [provider]  vitamin C (ASCORBIC ACID) 500 MG tablet Take 500 mg daily by mouth.    [provider]  Vitamin D, Cholecalciferol, 1000 units CAPS Take by mouth.    [provider]    Family History Family History  Problem Relation Age of Onset  . Diabetes Mother   . Hypertension Mother   . Heart disease Mother   . Heart disease Father     Social History Social History   Tobacco Use  . Smoking status: Former Smoker    Last attempt to quit: 1998    Years since quitting: 22.1  . Smokeless tobacco: Never Used  Substance Use Topics  . Alcohol use: No    Alcohol/week: 0.0 standard drinks    Comment: Quit 1992  . Drug use: No    Comment: Quit 1990     Allergies   Patient has no known allergies.   Review of Systems Review of Systems  Constitutional: Negative for chills and fever.  HENT: Positive for congestion and rhinorrhea. Negative for sore throat, trouble swallowing and voice change.   Eyes: Negative.  Negative for visual disturbance.  Respiratory: Positive for cough. Negative for shortness of breath.   Cardiovascular: Positive for chest pain. Negative for leg swelling.  Gastrointestinal: Negative.  Negative for abdominal pain, diarrhea, nausea and vomiting.  Musculoskeletal: Positive for back pain. Negative for neck pain and neck stiffness.  Neurological: Negative.  Negative for syncope, weakness and headaches.  All other systems reviewed and are negative.  Physical Exam Updated Vital Signs BP 131/76   Pulse (!) 56   Temp 97.9 F (36.6 C) (Oral)   Resp 15   Ht 5\' 9"  (1.753 m)   Wt 81.6 kg   SpO2 100%   BMI 26.58 kg/m   Physical Exam Constitutional:      General: She is not in acute distress.    Appearance: She is well-developed. She is not ill-appearing or diaphoretic.  HENT:     Head: Normocephalic and atraumatic.     Right Ear: Tympanic membrane, ear canal and external ear normal.     Left  Ear: Tympanic membrane, ear canal and external ear normal.     Nose: Rhinorrhea present. Rhinorrhea is clear.     Right Sinus: No maxillary sinus tenderness or frontal sinus tenderness.     Left Sinus: No maxillary sinus tenderness or frontal sinus tenderness.     Mouth/Throat:     Lips: Pink.     Mouth: Mucous membranes are moist.     Pharynx: Oropharynx is clear. Uvula midline.     Comments: The patient has normal phonation and is in control of secretions. No stridor.  Midline uvula without edema. Soft palate rises symmetrically. No tonsillar erythema,  swelling or exudates. Tongue protrusion is normal, floor of mouth is soft. No trismus. No creptius on neck palpation. No gingival erythema or fluctuance noted. Mucus membranes moist. Eyes:     General: Vision grossly intact. Gaze aligned appropriately.     Extraocular Movements: Extraocular movements intact.     Conjunctiva/sclera: Conjunctivae normal.     Pupils: Pupils are equal, round, and reactive to light.  Neck:     Musculoskeletal: Full passive range of motion without pain, normal range of motion and neck supple. No spinous process tenderness or muscular tenderness.     Trachea: Trachea and phonation normal. No tracheal deviation.  Cardiovascular:     Rate and Rhythm: Normal rate and regular rhythm.     Pulses:          Radial pulses are 2+ on the left side.       Dorsalis pedis pulses are 2+ on the right side and 2+ on the left side.       Posterior tibial pulses are 2+ on the right side and 2+ on the left side.     Heart sounds: Normal heart sounds.  Pulmonary:     Effort: Pulmonary effort is normal. No respiratory distress.     Breath sounds: Normal breath sounds and air entry. No decreased breath sounds, wheezing or rhonchi.  Chest:     Chest wall: No tenderness.  Abdominal:     General: Bowel sounds are normal. There is no distension.     Palpations: Abdomen is soft.     Tenderness: There is no abdominal tenderness. There  is no guarding or rebound.  Musculoskeletal: Normal range of motion.     Right lower leg: Normal. She exhibits no tenderness. No edema.     Left lower leg: Normal. She exhibits no tenderness. No edema.     Comments: No midline C/T/L spinal tenderness to palpation, no paraspinal muscle tenderness, no deformity, crepitus, or step-off noted. No sign of injury to the neck or back.  Feet:     Right foot:     Protective Sensation: 3 sites tested. 3 sites sensed.     Left foot:     Protective Sensation: 3 sites tested. 3 sites sensed.  Skin:    General: Skin is warm and dry.     Capillary Refill: Capillary refill takes less than 2 seconds.  Neurological:     General: No focal deficit present.     Mental Status: She is alert and oriented to person, place, and time.     GCS: GCS eye subscore is 4. GCS verbal subscore is 5. GCS motor subscore is 6.     Comments: Speech is clear and goal oriented, follows commands Major Cranial nerves without deficit, no facial droop Normal strength in upper and lower extremities bilaterally including dorsiflexion and plantar flexion, strong and equal grip strength Sensation normal to light touch Moves extremities without ataxia, coordination intact  Psychiatric:        Behavior: Behavior normal.    ED Treatments / Results  Labs (all labs ordered are listed, but only abnormal results are displayed) Labs Reviewed  BASIC METABOLIC PANEL - Abnormal; Notable for the following components:      Result Value   Potassium 3.3 (*)    Glucose, Bld 135 (*)    Creatinine, Ser 1.11 (*)    GFR calc non Af Amer 54 (*)    All other components within normal limits  CBC - Abnormal; Notable for the  following components:   RBC 5.25 (*)    HCT 46.3 (*)    All other components within normal limits  POCT I-STAT EG7 - Abnormal; Notable for the following components:   Bicarbonate 29.9 (*)    Acid-Base Excess 4.0 (*)    Potassium 3.2 (*)    All other components within normal  limits  TROPONIN I  MAGNESIUM  TROPONIN I    EKG EKG Interpretation  Date/Time:  Wednesday August 06 2018 09:42:14 EST Ventricular Rate:  77 PR Interval:  160 QRS Duration: 108 QT Interval:  410 QTC Calculation: 463 R Axis:   -35 Text Interpretation:  Normal sinus rhythm Left axis deviation Right bundle branch block Abnormal ECG When compared to prior, no significant changes seen.  No STEMI Confirmed by Theda Belfast ([id number]) on 08/06/2018 3:12:58 PM   Radiology Dg Chest 2 View  Result Date: 08/06/2018 CLINICAL DATA:  Chest tightness with mid back pain since yesterday. Cough. EXAM: CHEST - 2 VIEW COMPARISON:  01/25/2018 radiographs. FINDINGS: The heart size and mediastinal contours are stable. There is mild aortic atherosclerosis. The lungs are clear. There is no pleural effusion or pneumothorax. No acute osseous findings are evident. IMPRESSION: Stable chest without evidence of active cardiopulmonary process. Electronically Signed   By: Carey Bullocks M.D.   On: 08/06/2018 10:10   Ct Angio Chest/abd/pel For Dissection W And/or Wo Contrast  Result Date: 08/06/2018 CLINICAL DATA:  Acute chest tightness and back pain.  Cough. EXAM: CT ANGIOGRAPHY CHEST, ABDOMEN AND PELVIS TECHNIQUE: Multidetector CT imaging through the chest, abdomen and pelvis was performed using the standard protocol during bolus administration of intravenous contrast. Multiplanar reconstructed images and MIPs were obtained and reviewed to evaluate the vascular anatomy. CONTRAST:  ISOVUE-370 IOPAMIDOL (ISOVUE-370) INJECTION 76% COMPARISON:  Chest radiograph from earlier today. FINDINGS: CTA CHEST FINDINGS Cardiovascular: Normal heart size. No significant pericardial effusion/thickening. Left circumflex coronary atherosclerosis. Contrast opacification of the thoracic aorta is suboptimal. Atherosclerotic nonaneurysmal thoracic aorta. No evidence of thoracic aortic intramural hematoma, dissection, pseudoaneurysm or  penetrating atherosclerotic ulcer. Normal caliber pulmonary arteries. No central pulmonary emboli. Mediastinum/Nodes: No discrete thyroid nodules. Unremarkable esophagus. No pathologically enlarged axillary, mediastinal or hilar lymph nodes. Lungs/Pleura: No pneumothorax. No pleural effusion. No acute consolidative airspace disease, lung masses or significant pulmonary nodules. Musculoskeletal: No aggressive appearing focal osseous lesions. Mild thoracic spine spondylosis. Review of the MIP images confirms the above findings. CTA ABDOMEN AND PELVIS FINDINGS VASCULAR Aorta: Atherosclerotic abdominal aorta. Normal caliber aorta without aneurysm, dissection, vasculitis or significant stenosis. Celiac: Patent without evidence of aneurysm, dissection, vasculitis or significant stenosis. SMA: Patent without evidence of aneurysm, dissection, vasculitis or significant stenosis. Renals: Both renal arteries are patent without evidence of aneurysm, dissection, vasculitis, fibromuscular dysplasia or significant stenosis. IMA: Patent without evidence of aneurysm, dissection, vasculitis or significant stenosis. Inflow: Patent without evidence of aneurysm, dissection, vasculitis or significant stenosis. Veins: No obvious venous abnormality within the limitations of this arterial phase study. Review of the MIP images confirms the above findings. NON-VASCULAR Hepatobiliary: Normal liver with no liver mass. Normal gallbladder with no radiopaque cholelithiasis. No biliary ductal dilatation. Pancreas: Two tiny low-attenuation lesions in the pancreatic head, largest 0.5 cm (series 6/image 115). No pancreatic duct dilation. No peripancreatic fat stranding or fluid collections. Spleen: Normal size. No mass. Adrenals/Urinary Tract: Normal adrenals. Simple 1.1 cm lower left renal cyst. Additional subcentimeter hypodense renal cortical lesions in the right kidney, too small to characterize, which require no follow-up. No hydronephrosis.  Normal  bladder. Stomach/Bowel: Small hiatal hernia. Otherwise normal nondistended stomach. Normal caliber small bowel with no small bowel wall thickening. Appendectomy. Normal large bowel with no diverticulosis, large bowel wall thickening or pericolonic fat stranding. Vascular/Lymphatic: Atherosclerotic nonaneurysmal abdominal aorta. No pathologically enlarged lymph nodes in the abdomen or pelvis. Reproductive: Status post hysterectomy, with no abnormal findings at the vaginal cuff. No adnexal mass. Other: No pneumoperitoneum, ascites or focal fluid collection. Small fat containing right periumbilical hernia. Musculoskeletal: No aggressive appearing focal osseous lesions. Review of the MIP images confirms the above findings. IMPRESSION: 1. No acute aortic syndrome. 2. No active pulmonary disease. 3. One vessel coronary atherosclerosis. 4. Small hiatal hernia. 5. Tiny low-attenuation pancreatic head lesions, largest 0.5 cm. No pancreatic or biliary ductal dilatation. Short-term outpatient characterization with MRI abdomen without and with IV contrast is recommended. 6. Small fat containing right periumbilical hernia. 7.  Aortic Atherosclerosis (ICD10-I70.0). Electronically Signed   By: Delbert Phenix M.D.   On: 08/06/2018 14:05    Procedures Procedures (including critical care time)  Medications Ordered in ED Medications  sodium chloride flush (NS) 0.9 % injection 3 mL (3 mLs Intravenous Not Given 08/06/18 1037)  sodium chloride 0.9 % bolus 500 mL (0 mLs Intravenous Stopped 08/06/18 1245)  iopamidol (ISOVUE-370) 76 % injection 100 mL (100 mLs Intravenous Contrast Given 08/06/18 1314)     Initial Impression / Assessment and Plan / ED Course  I have reviewed the triage vital signs and the nursing notes.  Pertinent labs & imaging results that were available during my care of the patient were reviewed by me and considered in my medical decision making (see chart for details).    63 year old female arrives  for 1 day of mid upper back pain followed by chest heaviness.  Pain is not reproducible on exam.  Patient also with mild URI-like symptoms developed this morning.  Distal pulses equal and intact bilaterally.  No signs of DVT.  Patient is afebrile, not tachycardic with SPO2 of 97% on room air.  Chest x-ray negative.  Initial troponin negative.  EKG reviewed by Dr. Rush Landmark without acute findings.  Patient with short 4 second intermittent bigeminy on monitor.  Case discussed with Dr. Rush Landmark, plan at this time is to proceed dissection study for chest pain/upper back pain that is not reproducible in addition to delta troponin.  Patient is overall well-appearing and in no acute distress. - Initial troponin negative BMP nonacute CBC nonacute Magnesium level wnl Chest x-ray negative EKG without acute changes reviewed by Dr. Rush Landmark - CT study:  IMPRESSION: 1. No acute aortic syndrome. 2. No active pulmonary disease. 3. One vessel coronary atherosclerosis. 4. Small hiatal hernia. 5. Tiny low-attenuation pancreatic head lesions, largest 0.5 cm. No pancreatic or biliary ductal dilatation. Short-term outpatient characterization with MRI abdomen without and with IV contrast is recommended. 6. Small fat containing right periumbilical hernia. 7.  Aortic Atherosclerosis - Delta Troponin negative - Patient informed of all results and incidental findings today and need to follow-up for MRI of the abdomen.  She states understanding and will schedule this through her primary care provider, she plans to call her PCP today. - Patient reassessment resting comfortably in no acute distress.  She is reading her book and denying any and all pain.  She denies chest pain or shortness of breath.  States that her back pain has resolved.  States that she feels well and is requesting discharge as soon as possible. - Case and imaging discussed with Dr. Rush Landmark.  Advises that patient may be discharged with PCP and cardiology  outpatient follow-ups at this time. Potassium and Magnesium nonacute, no recurrent bigeminy since initial evaluation, advises cardiology outpatient follow-up. - At this time there does not appear to be any evidence of an acute emergency medical condition and the patient appears stable for discharge with appropriate outpatient follow up. Diagnosis was discussed with patient who verbalizes understanding of care plan and is agreeable to discharge. I have discussed return precautions with patient who verbalizes understanding of return precautions. Patient strongly encouraged to follow-up with their PCP and cardiology. All questions answered.  Patient's case rediscussed with Dr. Rush Landmark who agrees with plan to discharge with follow-up.   Note: Portions of this report may have been transcribed using voice recognition software. Every effort was made to ensure accuracy; however, inadvertent computerized transcription errors may still be present. Final Clinical Impressions(s) / ED Diagnoses   Final diagnoses:  Atypical chest pain    ED Discharge Orders    None       Elizabeth Palau 08/06/18 1522    Tegeler, Canary Brim, MD 08/06/18 [phone]

## 2018-08-06 NOTE — ED Triage Notes (Signed)
Reports productive cough with chest heaviness x 1.5 days.  Additionally reports pain radiates into her shoulder blades.

## 2018-08-13 ENCOUNTER — Ambulatory Visit
Admission: RE | Admit: 2018-08-13 | Discharge: 2018-08-13 | Disposition: A | Discharge status: Home / Self Care | Payer: BLUE CROSS/BLUE SHIELD | Source: Ambulatory Visit | Attending: Internal Medicine | Admitting: Internal Medicine

## 2018-08-13 DIAGNOSIS — Z1231 Encounter for screening mammogram for malignant neoplasm of breast: Secondary | ICD-10-CM

## 2018-08-14 ENCOUNTER — Other Ambulatory Visit (HOSPITAL_BASED_OUTPATIENT_CLINIC_OR_DEPARTMENT_OTHER): Payer: Self-pay | Admitting: Internal Medicine

## 2018-08-14 DIAGNOSIS — K869 Disease of pancreas, unspecified: Secondary | ICD-10-CM

## 2018-08-30 ENCOUNTER — Ambulatory Visit (HOSPITAL_BASED_OUTPATIENT_CLINIC_OR_DEPARTMENT_OTHER)
Admission: RE | Admit: 2018-08-30 | Discharge: 2018-08-30 | Disposition: A | Discharge status: Home / Self Care | Payer: BLUE CROSS/BLUE SHIELD | Source: Ambulatory Visit | Attending: Internal Medicine | Admitting: Internal Medicine

## 2018-08-30 DIAGNOSIS — K869 Disease of pancreas, unspecified: Secondary | ICD-10-CM | POA: Diagnosis not present

## 2018-08-30 MED ORDER — GADOBUTROL 1 MMOL/ML IV SOLN
8.1000 mL | Freq: Once | INTRAVENOUS | Status: AC | PRN
  Administered 2018-08-30: 8.1 mL via INTRAVENOUS

## 2018-09-01 ENCOUNTER — Other Ambulatory Visit (HOSPITAL_BASED_OUTPATIENT_CLINIC_OR_DEPARTMENT_OTHER): Payer: Self-pay | Admitting: Obstetrics and Gynecology

## 2019-01-28 ENCOUNTER — Encounter: Payer: Self-pay | Admitting: Cardiology

## 2019-01-29 ENCOUNTER — Ambulatory Visit: Payer: Self-pay | Admitting: Cardiology

## 2019-11-30 ENCOUNTER — Emergency Department (HOSPITAL_BASED_OUTPATIENT_CLINIC_OR_DEPARTMENT_OTHER): Payer: Managed Care, Other (non HMO)

## 2019-11-30 ENCOUNTER — Emergency Department (HOSPITAL_BASED_OUTPATIENT_CLINIC_OR_DEPARTMENT_OTHER)
Admission: EM | Admit: 2019-11-30 | Discharge: 2019-11-30 | Disposition: A | Discharge status: Home / Self Care | Payer: Managed Care, Other (non HMO) | Attending: Emergency Medicine | Admitting: Emergency Medicine

## 2019-11-30 ENCOUNTER — Encounter (HOSPITAL_BASED_OUTPATIENT_CLINIC_OR_DEPARTMENT_OTHER): Payer: Self-pay | Admitting: *Deleted

## 2019-11-30 ENCOUNTER — Other Ambulatory Visit: Payer: Self-pay

## 2019-11-30 DIAGNOSIS — I10 Essential (primary) hypertension: Secondary | ICD-10-CM | POA: Diagnosis not present

## 2019-11-30 DIAGNOSIS — R079 Chest pain, unspecified: Secondary | ICD-10-CM | POA: Diagnosis present

## 2019-11-30 DIAGNOSIS — Y9389 Activity, other specified: Secondary | ICD-10-CM | POA: Diagnosis not present

## 2019-11-30 DIAGNOSIS — Y929 Unspecified place or not applicable: Secondary | ICD-10-CM | POA: Diagnosis not present

## 2019-11-30 DIAGNOSIS — W19XXXA Unspecified fall, initial encounter: Secondary | ICD-10-CM | POA: Insufficient documentation

## 2019-11-30 DIAGNOSIS — M79645 Pain in left finger(s): Secondary | ICD-10-CM | POA: Diagnosis not present

## 2019-11-30 DIAGNOSIS — Y999 Unspecified external cause status: Secondary | ICD-10-CM | POA: Insufficient documentation

## 2019-11-30 DIAGNOSIS — M67442 Ganglion, left hand: Secondary | ICD-10-CM | POA: Diagnosis not present

## 2019-11-30 DIAGNOSIS — R42 Dizziness and giddiness: Secondary | ICD-10-CM | POA: Diagnosis not present

## 2019-11-30 DIAGNOSIS — M6740 Ganglion, unspecified site: Secondary | ICD-10-CM

## 2019-11-30 DIAGNOSIS — Z87891 Personal history of nicotine dependence: Secondary | ICD-10-CM | POA: Insufficient documentation

## 2019-11-30 LAB — CBC WITH DIFFERENTIAL/PLATELET
Abs Immature Granulocytes: 0.02 10*3/uL (ref 0.00–0.07)
Basophils Absolute: 0.1 10*3/uL (ref 0.0–0.1)
Basophils Relative: 1 %
Eosinophils Absolute: 0.5 10*3/uL (ref 0.0–0.5)
Eosinophils Relative: 10 %
HCT: 46.1 % — ABNORMAL HIGH (ref 36.0–46.0)
Hemoglobin: 14.9 g/dL (ref 12.0–15.0)
Immature Granulocytes: 0 %
Lymphocytes Relative: 37 %
Lymphs Abs: 2 10*3/uL (ref 0.7–4.0)
MCH: 29.3 pg (ref 26.0–34.0)
MCHC: 32.3 g/dL (ref 30.0–36.0)
MCV: 90.7 fL (ref 80.0–100.0)
Monocytes Absolute: 0.4 10*3/uL (ref 0.1–1.0)
Monocytes Relative: 7 %
Neutro Abs: 2.4 10*3/uL (ref 1.7–7.7)
Neutrophils Relative %: 45 %
Platelets: 235 10*3/uL (ref 150–400)
RBC: 5.08 MIL/uL (ref 3.87–5.11)
RDW: 13.5 % (ref 11.5–15.5)
WBC: 5.3 10*3/uL (ref 4.0–10.5)
nRBC: 0 % (ref 0.0–0.2)

## 2019-11-30 LAB — COMPREHENSIVE METABOLIC PANEL
ALT: 22 U/L (ref 0–44)
AST: 21 U/L (ref 15–41)
Albumin: 4.1 g/dL (ref 3.5–5.0)
Alkaline Phosphatase: 41 U/L (ref 38–126)
Anion gap: 9 (ref 5–15)
BUN: 17 mg/dL (ref 8–23)
CO2: 26 mmol/L (ref 22–32)
Calcium: 9.9 mg/dL (ref 8.9–10.3)
Chloride: 104 mmol/L (ref 98–111)
Creatinine, Ser: 0.88 mg/dL (ref 0.44–1.00)
GFR calc Af Amer: >60 mL/min (ref >60)
GFR calc non Af Amer: >60 mL/min (ref >60)
Glucose, Bld: 126 mg/dL — ABNORMAL HIGH (ref 70–99)
Potassium: 3.5 mmol/L (ref 3.5–5.1)
Sodium: 139 mmol/L (ref 135–145)
Total Bilirubin: 0.5 mg/dL (ref 0.3–1.2)
Total Protein: 7 g/dL (ref 6.5–8.1)

## 2019-11-30 LAB — TROPONIN I (HIGH SENSITIVITY)
Troponin I (High Sensitivity): 3 ng/L (ref <18)
Troponin I (High Sensitivity): 3 ng/L (ref <18)

## 2019-11-30 LAB — LIPASE, BLOOD: Lipase: 33 U/L (ref 11–51)

## 2019-11-30 NOTE — ED Triage Notes (Signed)
Dizziness started this morning. Chest pain for 2 weeks. Left hand hurts-fell a month ago at her job. C/o nausea.

## 2019-11-30 NOTE — ED Notes (Signed)
Ambulated in the hallway and she stated that, "I still have the swimming head."

## 2019-11-30 NOTE — ED Provider Notes (Signed)
[facility] [facility] EMERGENCY DEPARTMENT Provider Note   CSN: [id number] Arrival date & time: 11/30/19  0932     History Chief Complaint  Patient presents with  . Chest Pain  . Dizziness    Patricia Rodgers is a 64 y.o. female.  64 yo F with a chief complaints of chest pain.  This is been off and on for a couple weeks.  Nothing seems to make it occur or go away.  Nothing seems to make it better or worse.  She describes it as a dull ache.  Points to it with one finger.  No radiation.  Still has mild ache now.  Denies trauma denies cough congestion or fever.  She does not feel short of breath with this.  No diaphoresis no nausea.  Not related to food.  Denies history of MI has a history of hypertension hyperlipidemia and diabetes.  Denies smoking.  Father had an MI in his late 65s.  She denies history of PE or DVT denies hemoptysis denies unilateral lower extremity edema denies recent surgery immobilization or hospitalization denies estrogen use denies history of cancer.  She did have a stress test done about a year ago that was low risk.  She also is complaining of left thumb pain.  This been going on since she had a fall.  Describes the pain to the base of the thumb usually worse with flexion.  She did have difficulty using it to pick things up.  She feels that her arm is somewhat weak now and she feels little bit dizzy.   The history is provided by the patient.  Chest Pain Pain location:  R chest Pain quality: aching   Pain radiates to:  Does not radiate Pain severity:  Mild Onset quality:  Gradual Duration:  2 weeks Timing:  Intermittent Progression:  Waxing and waning Chronicity:  New Relieved by:  Nothing Worsened by:  Nothing Associated symptoms: dizziness and weakness   Associated symptoms: no fever, no headache, no nausea, no palpitations, no shortness of breath and no vomiting   Dizziness Associated symptoms: chest pain and weakness   Associated symptoms: no  headaches, no nausea, no palpitations, no shortness of breath and no vomiting        Past Medical History:  Diagnosis Date  . Breast mass   . Diabetes mellitus without complication (HCC)    Pre diabetic  . Hyperlipemia   . Hypertension   . Hypothyroidism   . Internal hemorrhoids   . Thyroid disease     Patient Active Problem List   Diagnosis Date Noted  . Paresthesia of right upper extremity 07/03/2016  . Angina pectoris (HCC) 04/25/2014    Past Surgical History:  Procedure Laterality Date  . ABDOMINAL HYSTERECTOMY    . APPENDECTOMY    . BREAST EXCISIONAL BIOPSY Right 2019  . DILATION AND CURETTAGE OF UTERUS    . LEFT HEART CATHETERIZATION WITH CORONARY ANGIOGRAM N/A 04/26/2014   Procedure: LEFT HEART CATHETERIZATION WITH CORONARY ANGIOGRAM;  Surgeon: Pamella Pert, MD;  Location: [facility] CATH LAB;  Service: Cardiovascular;  Laterality: N/A;  . RADIOACTIVE SEED GUIDED EXCISIONAL BREAST BIOPSY Right 05/13/2017   Procedure: RIGHT RADIOACTIVE SEED GUIDED EXCISIONAL BREAST BIOPSY ERAS PATHWAY;  Surgeon: Ovidio Kin, MD;  Location: [facility];  Service: General;  Laterality: Right;     OB History    Gravida  4   Para  2   Term  2   Preterm      AB  2   Living  2     SAB      TAB  2   Ectopic      Multiple      Live Births              Family History  Problem Relation Age of Onset  . Diabetes Mother   . Hypertension Mother   . Heart disease Mother   . Heart disease Father     Social History   Tobacco Use  . Smoking status: Former Smoker    Quit date: 1998    Years since quitting: 23.4  . Smokeless tobacco: Never Used  Substance Use Topics  . Alcohol use: No    Alcohol/week: 0.0 standard drinks    Comment: Quit 1992  . Drug use: No    Comment: Quit 1990    Home Medications Prior to Admission medications   Medication Sig Start Date End Date Taking? Authorizing Provider  aspirin EC 81 MG tablet Take 81 mg by mouth.   Yes  [provider]  atorvastatin (LIPITOR) 40 MG tablet TAKE ONE TABLET BY MOUTH ONCE DAILY 04/16/16  Yes [provider]  levothyroxine (SYNTHROID, LEVOTHROID) 75 MCG tablet Take 75 mcg by mouth daily before breakfast.   Yes [provider]  losartan-hydrochlorothiazide (HYZAAR) 100-12.5 MG per tablet Take 1 tablet by mouth daily.   Yes [provider]  vitamin C (ASCORBIC ACID) 500 MG tablet Take 500 mg daily by mouth.    [provider]    Allergies    Patient has no known allergies.  Review of Systems   Review of Systems  Constitutional: Negative for chills and fever.  HENT: Negative for congestion and rhinorrhea.   Eyes: Negative for redness and visual disturbance.  Respiratory: Negative for shortness of breath and wheezing.   Cardiovascular: Positive for chest pain. Negative for palpitations.  Gastrointestinal: Negative for nausea and vomiting.  Genitourinary: Negative for dysuria and urgency.  Musculoskeletal: Positive for arthralgias and myalgias.  Skin: Negative for pallor and wound.  Neurological: Positive for dizziness and weakness. Negative for headaches.  64 yo F   Physical Exam Updated Vital Signs BP 126/69   Pulse (!) 47   Temp 98.3 F (36.8 C) (Oral)   Resp 15   Ht 5\' 9"  (1.753 m)   Wt 94.2 kg   SpO2 100%   BMI 30.67 kg/m   Physical Exam Vitals and nursing note reviewed.  Constitutional:      General: She is not in acute distress.    Appearance: She is well-developed. She is not diaphoretic.  HENT:     Head: Normocephalic and atraumatic.  Eyes:     Pupils: Pupils are equal, round, and reactive to light.  Cardiovascular:     Rate and Rhythm: Normal rate and regular rhythm.     Heart sounds: No murmur. No friction rub. No gallop.   Pulmonary:     Effort: Pulmonary effort is normal.     Breath sounds: No wheezing or rales.  Chest:     Chest wall: Tenderness ( Pain with palpation of the right chest wall along  the medial clavicular line about ribs 3 through 5 reproduces pain.) present.  Abdominal:     General: There is no distension.     Palpations: Abdomen is soft.     Tenderness: There is no abdominal tenderness.  Musculoskeletal:        General: No tenderness.     Cervical  back: Normal range of motion and neck supple.     Comments: Palpable nodule along the flexor tendon sheath at the MTP of the left first digit.  Full range of motion.  No appreciable snuffbox tenderness.  No ligamentous laxity to the base of the thumb.  PMS intact to the L hand.  Negative Tinel's to the median canal  Skin:    General: Skin is warm and dry.  Neurological:     Mental Status: She is alert and oriented to person, place, and time.  Psychiatric:        Behavior: Behavior normal.     ED Results / Procedures / Treatments   Labs (all labs ordered are listed, but only abnormal results are displayed) Labs Reviewed  CBC WITH DIFFERENTIAL/PLATELET - Abnormal; Notable for the following components:      Result Value   HCT 46.1 (*)    All other components within normal limits  COMPREHENSIVE METABOLIC PANEL - Abnormal; Notable for the following components:   Glucose, Bld 126 (*)    All other components within normal limits  LIPASE, BLOOD  TROPONIN I (HIGH SENSITIVITY)  TROPONIN I (HIGH SENSITIVITY)    EKG EKG Interpretation  Date/Time:  Monday November 30 2019 09:43:06 EDT Ventricular Rate:  51 PR Interval:    QRS Duration: 123 QT Interval:  463 QTC Calculation: 427 R Axis:   22 Text Interpretation: Sinus rhythm IVCD, consider atypical RBBB No significant change since last tracing Confirmed by Melene Plan [phone]) on 11/30/2019 9:46:06 AM   Radiology DG Chest 2 View  Result Date: 11/30/2019 CLINICAL DATA:  Chest pain for 2 weeks with dizziness EXAM: CHEST - 2 VIEW COMPARISON:  08/06/2018 FINDINGS: Cardiac shadow is within normal limits. Aortic calcifications are noted. No infiltrate or effusion is seen.  Degenerative changes of the thoracic spine are noted. IMPRESSION: No acute abnormality noted. Aortic Atherosclerosis (ICD10-I70.0). Electronically Signed   By: Alcide Clever M.D.   On: 11/30/2019 10:23   DG Hand Complete Left  Result Date: 11/30/2019 CLINICAL DATA:  Left thumb pain following fall, initial encounter EXAM: LEFT HAND - COMPLETE 3+ VIEW COMPARISON:  None. FINDINGS: No acute fracture or dislocation is noted. No soft tissue abnormality is seen. Mild degenerative changes of the first [facility] joint are noted. IMPRESSION: No acute abnormality noted. Electronically Signed   By: Alcide Clever M.D.   On: 11/30/2019 10:25    Procedures Procedures (including critical care time)  Medications Ordered in ED Medications - No data to display  ED Course  I have reviewed the triage vital signs and the nursing notes.  Pertinent labs & imaging results that were available during my care of the patient were reviewed by me and considered in my medical decision making (see chart for details).    MDM Rules/Calculators/A&P                      64  Yo F with a chief complaint of chest pain.  This is atypical in nature intermittent off and on for a couple weeks.  Reproduced on exam.  Most likely this is musculoskeletal in nature.  The patient does have multiple risk factors and therefore will obtain a delta troponin.  I feel her presentation is completely atypical of ulnar embolism and she has no risk factors for this do not feel that further work-up for that is warranted.  The patient separately is complaining about left thumb pain which I think most likely is  a ganglion cyst on history and exam.  She also is describing some dizziness that she has difficulty describing.  No obvious neurologic deficit with the left upper extremity though of the thumb was limited secondary to pain.  Plain film of the hand without fracture is viewed by me.  Plain film the chest without focal infiltrate or pneumothorax.  Delta  troponin is negative.  No significant electrolyte abnormality no significant anemia.  She had some lightheadedness initially but this is improved somewhat.  I have her follow-up with her family doctor.  1:41 PM:  I have discussed the diagnosis/risks/treatment options with the patient and believe the pt to be eligible for discharge home to follow-up with PCP. We also discussed returning to the ED immediately if new or worsening sx occur. We discussed the sx which are most concerning (e.g., sudden worsening pain, fever, inability to tolerate by mouth) that necessitate immediate return. Medications administered to the patient during their visit and any new prescriptions provided to the patient are listed below.  Medications given during this visit Medications - No data to display   The patient appears reasonably screen and/or stabilized for discharge and I doubt any other medical condition or other [facility] requiring further screening, evaluation, or treatment in the ED at this time prior to discharge.    Final Clinical Impression(s) / ED Diagnoses Final diagnoses:  Nonspecific chest pain  Dizziness  Ganglion cyst    Rx / DC Orders ED Discharge Orders    None       Melene Plan, DO 11/30/19 1341

## 2019-11-30 NOTE — Discharge Instructions (Signed)
Take tylenol 1000mg (2 extra strength) four times a day.   Follow up with your family doc.  They may want to send you to a cardiologist .

## 2020-06-13 IMAGING — MG DIGITAL SCREENING BILATERAL MAMMOGRAM WITH TOMO AND CAD
8 series · 8 of 24 positions shown · non-contrast
Comparison: Previous exam(s).

CLINICAL DATA: Screening.

EXAM:
DIGITAL SCREENING BILATERAL MAMMOGRAM WITH TOMO AND CAD

[L CC synth-2D]
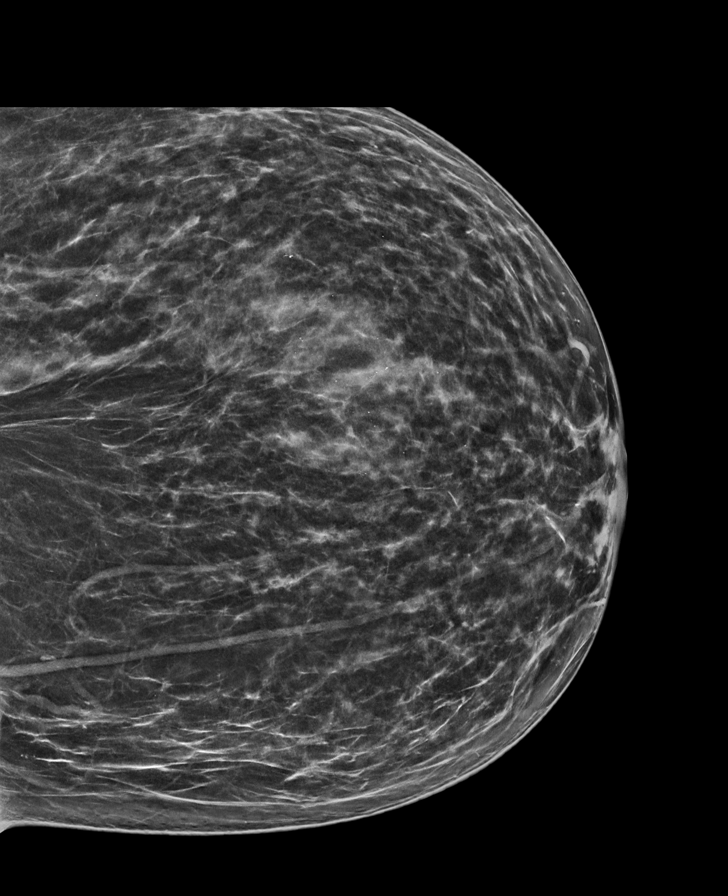

[L MLO synth-2D]
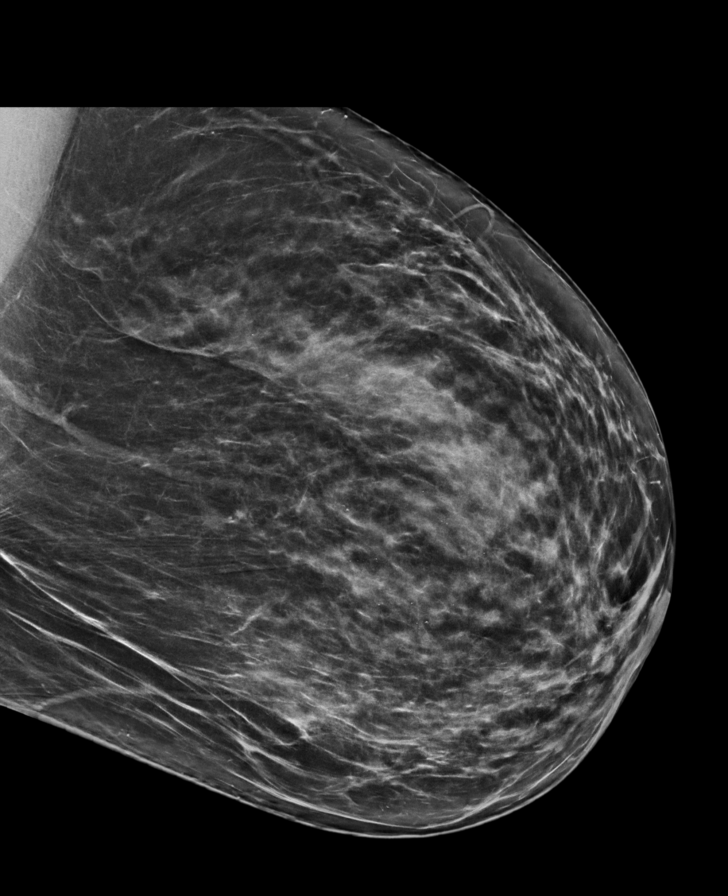

[R MLO synth-2D]
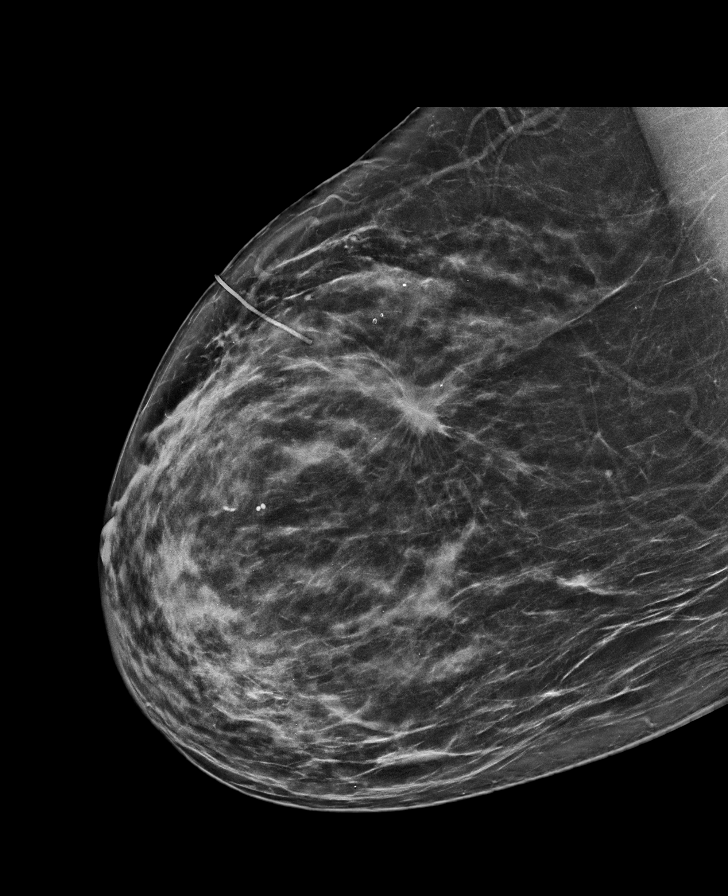

[R CC synth-2D]
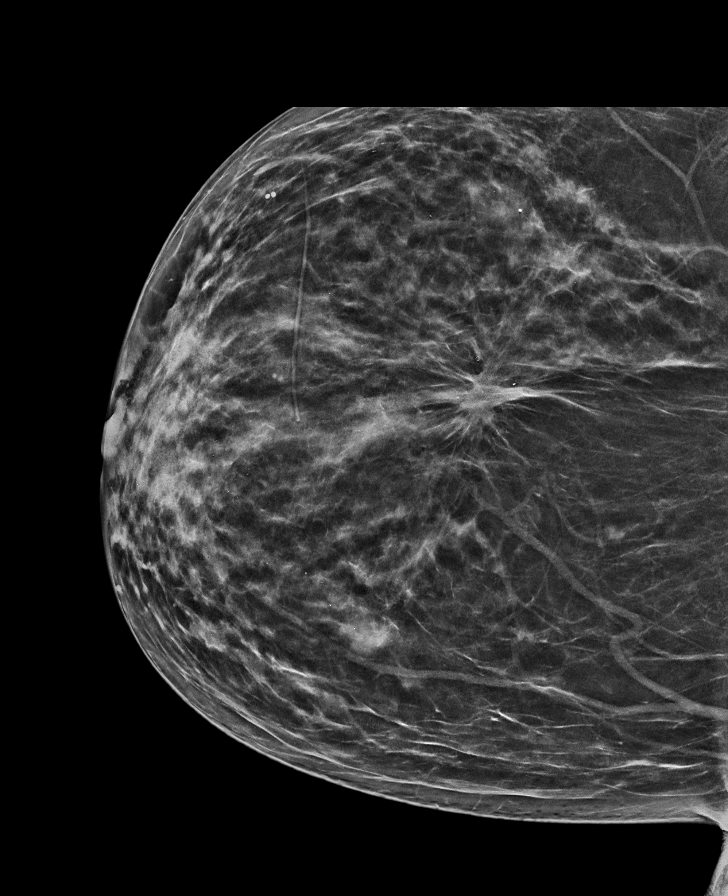

[L MLO tomo · tomo slice 45/90.0]
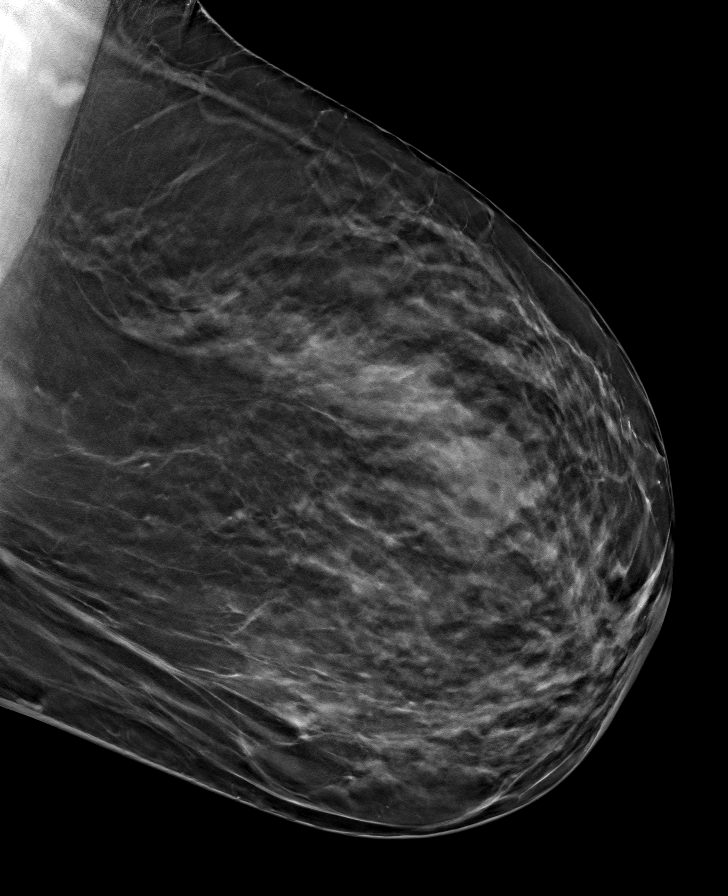

[L CC tomo · tomo slice 40/79.0]
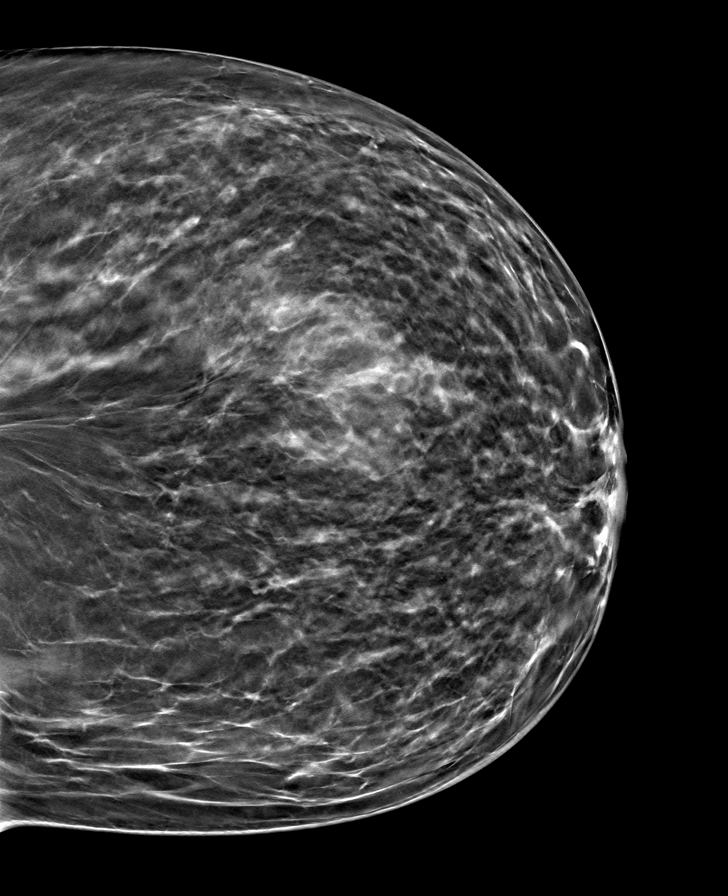

[R CC tomo · tomo slice 37/73.0]
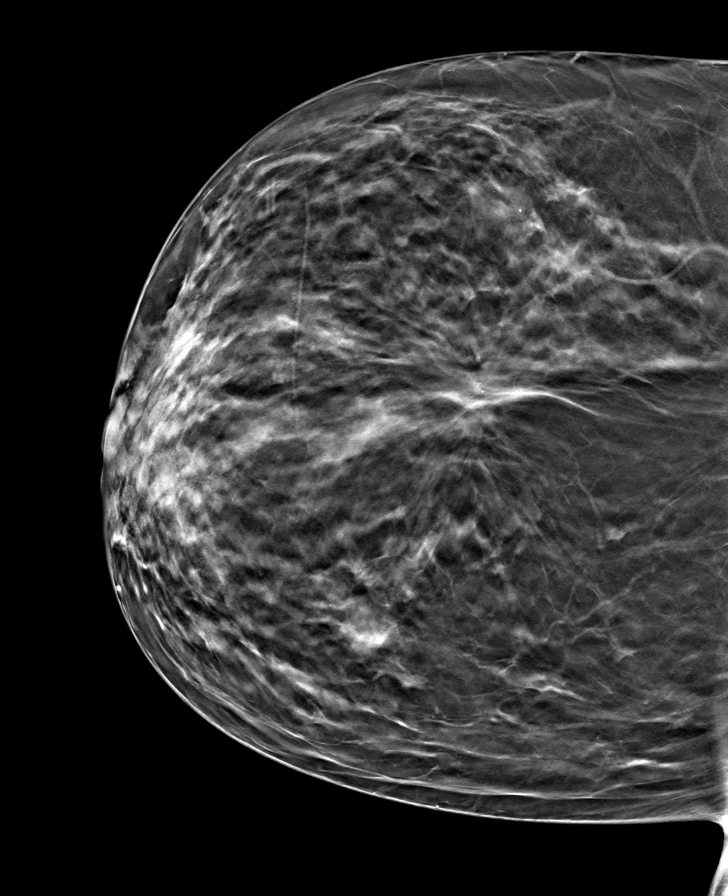

[R MLO tomo · tomo slice 44/87.0]
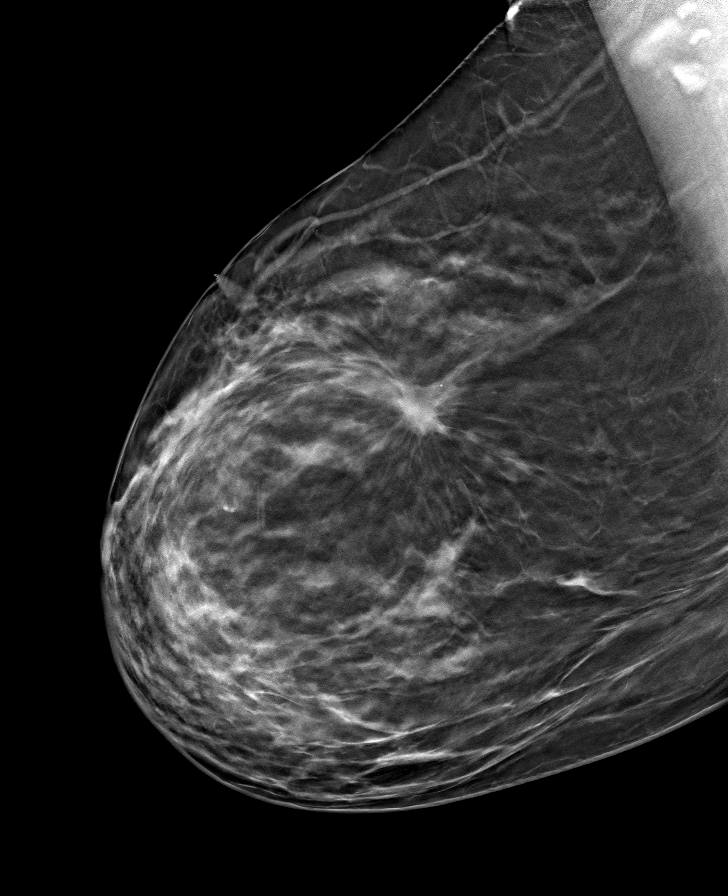

[8 of 24 positions shown; findings below may reference images not displayed]

ACR Breast Density Category c: The breast tissue is heterogeneously
dense, which may obscure small masses.
FINDINGS: There are no findings suspicious for malignancy. Images were
processed with CAD.
IMPRESSION: No mammographic evidence of malignancy. A result letter of this
screening mammogram will be mailed directly to the patient.

RECOMMENDATION:
Screening mammogram in one year. (Code:[id number])

BI-RADS CATEGORY  1: Negative.

## 2020-07-04 ENCOUNTER — Other Ambulatory Visit: Payer: Managed Care, Other (non HMO)

## 2020-11-08 ENCOUNTER — Other Ambulatory Visit: Payer: Self-pay | Admitting: *Deleted

## 2021-05-25 ENCOUNTER — Encounter (INDEPENDENT_AMBULATORY_CARE_PROVIDER_SITE_OTHER): Payer: Self-pay

## 2021-06-12 ENCOUNTER — Encounter (INDEPENDENT_AMBULATORY_CARE_PROVIDER_SITE_OTHER): Payer: Self-pay | Admitting: Ophthalmology

## 2021-06-12 ENCOUNTER — Other Ambulatory Visit: Payer: Self-pay

## 2021-06-12 ENCOUNTER — Ambulatory Visit (INDEPENDENT_AMBULATORY_CARE_PROVIDER_SITE_OTHER): Payer: 59 | Admitting: Ophthalmology

## 2021-06-12 DIAGNOSIS — H2513 Age-related nuclear cataract, bilateral: Secondary | ICD-10-CM

## 2021-06-12 DIAGNOSIS — H43823 Vitreomacular adhesion, bilateral: Secondary | ICD-10-CM | POA: Diagnosis not present

## 2021-06-12 DIAGNOSIS — E119 Type 2 diabetes mellitus without complications: Secondary | ICD-10-CM

## 2021-06-12 NOTE — Assessment & Plan Note (Signed)
Mild, not visually significant cataracts

## 2021-06-12 NOTE — Patient Instructions (Signed)
Patient would like to see general ophthalmology examination and evaluation for degree of cataract that could be contributing to her nighttime driving difficulties

## 2021-06-12 NOTE — Progress Notes (Signed)
06/12/2021     CHIEF COMPLAINT Patient presents for  Chief Complaint  Patient presents with   Retina Evaluation      HISTORY OF PRESENT ILLNESS: Patricia Rodgers is a 65 y.o. female who presents to the clinic today for:   HPI     Retina Evaluation           Laterality: left eye   Associated Symptoms: Glare.  Negative for Flashes, Floaters and Distortion   Treatments tried: no treatments         Comments   NP- Diabetic Eye Exam, referred by PCP (notes reviewed). Pt states her brother sees Dr. Luciana Axe and she decided she wanted to see Dr Luciana Axe as well.   ,, type 2, now 2 years duration of DM Pt states "my left eye, night driving or sunlight I notice a glare. I have not had cataract surgery." Pt denies FOL or Floaters. Type 2 Diabetic, diagnosed 2 years, pt states just started taking medication for it as of 2 weeks ago.  A1C: "6.1 or something like that," per patient. LBS: Not checked this morning, pt states normally runs around 103.      Last edited by Edmon Crape, MD on 06/12/2021  9:45 AM.      Referring physician: Jackie Plum, MD [id number] [place],  [place] [address]  HISTORICAL INFORMATION:   Selected notes from the MEDICAL RECORD NUMBER       CURRENT MEDICATIONS: No current outpatient medications on file. (Ophthalmic Drugs)   No current facility-administered medications for this visit. (Ophthalmic Drugs)   Current Outpatient Medications (Other)  Medication Sig   aspirin EC 81 MG tablet Take 81 mg by mouth.   atorvastatin (LIPITOR) 40 MG tablet TAKE ONE TABLET BY MOUTH ONCE DAILY   levothyroxine (SYNTHROID, LEVOTHROID) 75 MCG tablet Take 75 mcg by mouth daily before breakfast.   losartan-hydrochlorothiazide (HYZAAR) 100-12.5 MG per tablet Take 1 tablet by mouth daily.   vitamin C (ASCORBIC ACID) 500 MG tablet Take 500 mg daily by mouth.   No current facility-administered medications for this visit. (Other)      REVIEW OF  SYSTEMS:    ALLERGIES No Known Allergies  PAST MEDICAL HISTORY Past Medical History:  Diagnosis Date   Breast mass    Diabetes mellitus without complication (HCC)    Pre diabetic   Hyperlipemia    Hypertension    Hypothyroidism    Internal hemorrhoids    Thyroid disease    Past Surgical History:  Procedure Laterality Date   ABDOMINAL HYSTERECTOMY     APPENDECTOMY     BREAST EXCISIONAL BIOPSY Right 2019   DILATION AND CURETTAGE OF UTERUS     LEFT HEART CATHETERIZATION WITH CORONARY ANGIOGRAM N/A 04/26/2014   Procedure: LEFT HEART CATHETERIZATION WITH CORONARY ANGIOGRAM;  Surgeon: Pamella Pert, MD;  Location: [facility] CATH LAB;  Service: Cardiovascular;  Laterality: N/A;   RADIOACTIVE SEED GUIDED EXCISIONAL BREAST BIOPSY Right 05/13/2017   Procedure: RIGHT RADIOACTIVE SEED GUIDED EXCISIONAL BREAST BIOPSY ERAS PATHWAY;  Surgeon: Ovidio Kin, MD;  Location: [facility];  Service: General;  Laterality: Right;    FAMILY HISTORY Family History  Problem Relation Age of Onset   Diabetes Mother    Hypertension Mother    Heart disease Mother    Heart disease Father     SOCIAL HISTORY Social History   Tobacco Use   Smoking status: Former    Types: Cigarettes    Quit  date: 30    Years since quitting: 24.9   Smokeless tobacco: Never  Substance Use Topics   Alcohol use: No    Alcohol/week: 0.0 standard drinks    Comment: Quit 1992   Drug use: No    Comment: Quit 1990         OPHTHALMIC EXAM:  Base Eye Exam     Visual Acuity (ETDRS)       Right Left   Dist cc 20/20 -2 20/20    Correction: Glasses         Tonometry (Tonopen, 8:59 AM)       Right Left   Pressure 14 17         Pupils       Pupils Dark Light Shape React APD   Right PERRL 3 2 Round Brisk None   Left PERRL 3 2 Round Brisk None         Visual Fields (Counting fingers)       Left Right    Full Full         Extraocular Movement       Right Left    Full Full          Neuro/Psych     Oriented x3: Yes   Mood/Affect: Normal         Dilation     Both eyes: 1.0% Mydriacyl, 2.5% Phenylephrine @ 9:00 AM           Slit Lamp and Fundus Exam     External Exam       Right Left   External Normal Normal         Slit Lamp Exam       Right Left   Lids/Lashes Normal Normal   Conjunctiva/Sclera White and quiet White and quiet   Cornea Clear Clear   Anterior Chamber Deep and quiet Deep and quiet   Iris Round and reactive Round and reactive   Lens 2+ Nuclear sclerosis 2+ Nuclear sclerosis   Anterior Vitreous Normal Normal         Fundus Exam       Right Left   Posterior Vitreous Normal Normal   Disc Normal Normal   C/D Ratio 0.6 0.5   Macula Normal Normal   Vessels no DR no DR   Periphery Normal Normal            IMAGING AND PROCEDURES  Imaging and Procedures for 06/12/21  OCT, Retina - OU - Both Eyes       Right Eye Quality was good. Central Foveal Thickness: 272. Findings include vitreomacular adhesion .   Left Eye Quality was good. Scan locations included subfoveal. Central Foveal Thickness: 269. Findings include vitreomacular adhesion .   Notes OU with no active maculopathy and physiologic VMA at the fovea with no foveal distortion observation appropriate      Color Fundus Photography Optos - OU - Both Eyes       Right Eye Progression has no prior data. Disc findings include normal observations, increased cup to disc ratio. Macula : normal observations. Vessels : normal observations. Periphery : normal observations.   Left Eye Progression has no prior data. Macula : normal observations. Vessels : normal observations. Periphery : normal observations.   Notes No detectable diabetic retinopathy             ASSESSMENT/PLAN:  Diabetes mellitus without complication (HCC) The patient has diabetes without any evidence of retinopathy. The patient advised to maintain good  blood glucose control,  excellent blood pressure control, and favorable levels of cholesterol, low density lipoprotein, and high density lipoproteins. Follow up in 1 year was recommended. Explained that fluctuations in visual acuity , or "out of focus", may result from large variations of blood sugar control.  Nuclear sclerotic cataract of both eyes Mild, not visually significant cataracts     ICD-10-CM   1. 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 Diabetes mellitus without complication (HCC)  E11.9 Color Fundus Photography Optos - OU - Both Eyes    3. Nuclear sclerotic cataract of both eyes  H25.13       1.  OU, with mild nuclear sclerotic cataracts with some nighttime glare and difficulty driving for which after discussion patient does want to seek evaluation with potential cataract surgeon for alleviation of the symptoms  2.  OU no detectable diabetic retinopathy  3.  OU with physiologic vitreomacular adhesion  Will refer to general ophthalmology  Ophthalmic Meds Ordered this visit:  No orders of the defined types were placed in this encounter.      Return in about 1 year (around 06/12/2022) for DILATE OU, OCT.  Patient Instructions  Patient would like to see general ophthalmology examination and evaluation for degree of cataract that could be contributing to her nighttime driving difficulties   Explained the diagnoses, plan, and follow up with the patient and they expressed understanding.  Patient expressed understanding of the importance of proper follow up care.   Alford [place] Ashyia Schraeder M.D. Diseases & Surgery of the Retina and Vitreous [facility] 06/12/21     Abbreviations: M myopia (nearsighted); A astigmatism; H hyperopia (farsighted); P presbyopia; Mrx spectacle prescription;  CTL contact lenses; OD right eye; OS left eye; OU both eyes  XT exotropia; ET esotropia; PEK punctate epithelial keratitis; PEE punctate epithelial erosions; DES dry  eye syndrome; MGD meibomian gland dysfunction; ATs artificial tears; PFAT's preservative free artificial tears; NSC nuclear sclerotic cataract; PSC posterior subcapsular cataract; ERM epi-retinal membrane; PVD posterior vitreous detachment; RD retinal detachment; DM diabetes mellitus; DR diabetic retinopathy; NPDR non-proliferative diabetic retinopathy; PDR proliferative diabetic retinopathy; CSME clinically significant macular edema; DME diabetic macular edema; dbh dot blot hemorrhages; CWS cotton wool spot; POAG primary open angle glaucoma; C/D cup-to-disc ratio; HVF humphrey visual field; GVF goldmann visual field; OCT optical coherence tomography; IOP intraocular pressure; BRVO Branch retinal vein occlusion; CRVO central retinal vein occlusion; CRAO central retinal artery occlusion; BRAO branch retinal artery occlusion; RT retinal tear; SB scleral buckle; PPV pars plana vitrectomy; VH Vitreous hemorrhage; PRP panretinal laser photocoagulation; IVK intravitreal kenalog; VMT vitreomacular traction; MH Macular hole;  NVD neovascularization of the disc; NVE neovascularization elsewhere; AREDS age related eye disease study; ARMD age related macular degeneration; POAG primary open angle glaucoma; EBMD epithelial/anterior basement membrane dystrophy; ACIOL anterior chamber intraocular lens; IOL intraocular lens; PCIOL posterior chamber intraocular lens; Phaco/IOL phacoemulsification with intraocular lens placement; PRK photorefractive keratectomy; LASIK laser assisted in situ keratomileusis; HTN hypertension; DM diabetes mellitus; COPD chronic obstructive pulmonary disease

## 2021-06-12 NOTE — Assessment & Plan Note (Signed)

## 2021-08-28 ENCOUNTER — Encounter: Payer: Self-pay | Admitting: Internal Medicine

## 2021-12-25 ENCOUNTER — Emergency Department (HOSPITAL_BASED_OUTPATIENT_CLINIC_OR_DEPARTMENT_OTHER): Payer: 59

## 2021-12-25 ENCOUNTER — Other Ambulatory Visit: Payer: Self-pay

## 2021-12-25 ENCOUNTER — Emergency Department (HOSPITAL_BASED_OUTPATIENT_CLINIC_OR_DEPARTMENT_OTHER)
Admission: EM | Admit: 2021-12-25 | Discharge: 2021-12-25 | Discharge status: Against Medical Advice | Payer: 59 | Attending: Emergency Medicine | Admitting: Emergency Medicine

## 2021-12-25 DIAGNOSIS — R0602 Shortness of breath: Secondary | ICD-10-CM | POA: Diagnosis not present

## 2021-12-25 DIAGNOSIS — Z79899 Other long term (current) drug therapy: Secondary | ICD-10-CM | POA: Diagnosis not present

## 2021-12-25 DIAGNOSIS — R079 Chest pain, unspecified: Secondary | ICD-10-CM | POA: Insufficient documentation

## 2021-12-25 DIAGNOSIS — M546 Pain in thoracic spine: Secondary | ICD-10-CM | POA: Diagnosis present

## 2021-12-25 DIAGNOSIS — Z7982 Long term (current) use of aspirin: Secondary | ICD-10-CM | POA: Diagnosis not present

## 2021-12-25 LAB — CBC WITH DIFFERENTIAL/PLATELET
Abs Immature Granulocytes: 0.01 10*3/uL (ref 0.00–0.07)
Basophils Absolute: 0.1 10*3/uL (ref 0.0–0.1)
Basophils Relative: 1 %
Eosinophils Absolute: 0.4 10*3/uL (ref 0.0–0.5)
Eosinophils Relative: 9 %
HCT: 42.2 % (ref 36.0–46.0)
Hemoglobin: 14.4 g/dL (ref 12.0–15.0)
Immature Granulocytes: 0 %
Lymphocytes Relative: 37 %
Lymphs Abs: 1.8 10*3/uL (ref 0.7–4.0)
MCH: 29.8 pg (ref 26.0–34.0)
MCHC: 34.1 g/dL (ref 30.0–36.0)
MCV: 87.2 fL (ref 80.0–100.0)
Monocytes Absolute: 0.4 10*3/uL (ref 0.1–1.0)
Monocytes Relative: 8 %
Neutro Abs: 2.1 10*3/uL (ref 1.7–7.7)
Neutrophils Relative %: 45 %
Platelets: 232 10*3/uL (ref 150–400)
RBC: 4.84 MIL/uL (ref 3.87–5.11)
RDW: 14.6 % (ref 11.5–15.5)
WBC: 4.9 10*3/uL (ref 4.0–10.5)
nRBC: 0 % (ref 0.0–0.2)

## 2021-12-25 LAB — COMPREHENSIVE METABOLIC PANEL
ALT: 24 U/L (ref 0–44)
AST: 26 U/L (ref 15–41)
Albumin: 5.1 g/dL — ABNORMAL HIGH (ref 3.5–5.0)
Alkaline Phosphatase: 47 U/L (ref 38–126)
Anion gap: 9 (ref 5–15)
BUN: 20 mg/dL (ref 8–23)
CO2: 28 mmol/L (ref 22–32)
Calcium: 10.3 mg/dL (ref 8.9–10.3)
Chloride: 102 mmol/L (ref 98–111)
Creatinine, Ser: 1.11 mg/dL — ABNORMAL HIGH (ref 0.44–1.00)
GFR, Estimated: 56 mL/min — ABNORMAL LOW (ref >60)
Glucose, Bld: 115 mg/dL — ABNORMAL HIGH (ref 70–99)
Potassium: 3.3 mmol/L — ABNORMAL LOW (ref 3.5–5.1)
Sodium: 139 mmol/L (ref 135–145)
Total Bilirubin: 0.5 mg/dL (ref 0.3–1.2)
Total Protein: 7.3 g/dL (ref 6.5–8.1)

## 2021-12-25 LAB — TROPONIN I (HIGH SENSITIVITY): Troponin I (High Sensitivity): 4 ng/L (ref <18)

## 2021-12-25 MED ORDER — POTASSIUM CHLORIDE CRYS ER 20 MEQ PO TBCR
40.0000 meq | EXTENDED_RELEASE_TABLET | Freq: Once | ORAL | Status: AC
  Administered 2021-12-25: 40 meq via ORAL
  Filled 2021-12-25: qty 2

## 2021-12-25 NOTE — ED Provider Notes (Signed)
[facility] EMERGENCY DEPARTMENT Provider Note   CSN: [id number] Arrival date & time: 12/25/21  1049     History  Chief Complaint  Patient presents with   Back Pain   Shortness of Breath    Patricia Rodgers is a 66 y.o. female.  Patient presents ER chief complaint of upper back soreness and heaviness, intermittently ongoing for the past 4 to 5 days.  Associated with some chest tightness and shortness of breath occurred today.  Denies any severe pain but describes it as a heaviness type sensation in her back and her chest.  Otherwise denies any cough or fevers no vomiting or diarrhea no diaphoresis, had a sensation of shortness of breath however.       Home Medications Prior to Admission medications   Medication Sig Start Date End Date Taking? Authorizing Provider  aspirin EC 81 MG tablet Take 81 mg by mouth.    [provider]  atorvastatin (LIPITOR) 40 MG tablet TAKE ONE TABLET BY MOUTH ONCE DAILY 04/16/16   [provider]  levothyroxine (SYNTHROID, LEVOTHROID) 75 MCG tablet Take 75 mcg by mouth daily before breakfast.    [provider]  losartan-hydrochlorothiazide (HYZAAR) 100-12.5 MG per tablet Take 1 tablet by mouth daily.    [provider]  vitamin C (ASCORBIC ACID) 500 MG tablet Take 500 mg daily by mouth.    [provider]      Allergies    Patient has no known allergies.    Review of Systems   Review of Systems  Constitutional:  Negative for fever.  HENT:  Negative for ear pain.   Eyes:  Negative for pain.  Respiratory:  Negative for cough.   Cardiovascular:  Positive for chest pain.  Gastrointestinal:  Negative for abdominal pain.  Genitourinary:  Negative for flank pain.  Musculoskeletal:  Positive for back pain.  Skin:  Negative for rash.  Neurological:  Negative for headaches.    Physical Exam Updated Vital Signs BP 114/75   Pulse (!) 52   Temp 98 F (36.7 C) (Oral)   Resp 15   Ht 5\' 9"   (1.753 m)   Wt 89.8 kg   SpO2 100%   BMI 29.24 kg/m  Physical Exam Constitutional:      General: She is not in acute distress.    Appearance: Normal appearance.  HENT:     Head: Normocephalic.     Nose: Nose normal.  Eyes:     Extraocular Movements: Extraocular movements intact.  Cardiovascular:     Rate and Rhythm: Normal rate.  Pulmonary:     Effort: Pulmonary effort is normal.  Musculoskeletal:        General: Normal range of motion.     Cervical back: Normal range of motion.     Right lower leg: No tenderness. No edema.     Left lower leg: No tenderness. No edema.  Neurological:     General: No focal deficit present.     Mental Status: She is alert. Mental status is at baseline.     ED Results / Procedures / Treatments   Labs (all labs ordered are listed, but only abnormal results are displayed) Labs Reviewed  COMPREHENSIVE METABOLIC PANEL - Abnormal; Notable for the following components:      Result Value   Potassium 3.3 (*)    Glucose, Bld 115 (*)    Creatinine, Ser 1.11 (*)    Albumin 5.1 (*)    GFR, Estimated 56 (*)  All other components within normal limits  CBC WITH DIFFERENTIAL/PLATELET  TROPONIN I (HIGH SENSITIVITY)    EKG None  Radiology DG Chest 2 View  Result Date: 12/25/2021 CLINICAL DATA:  shortness of breath EXAM: CHEST - 2 VIEW COMPARISON:  Chest x-ray November 30, 2019. FINDINGS: The heart size and mediastinal contours are within normal limits. Both lungs are clear. No visible pleural effusions or pneumothorax. No acute osseous abnormality. Mild S-shaped thoracolumbar curvature. IMPRESSION: No active cardiopulmonary disease. Electronically Signed   By: Feliberto Harts M.D.   On: 12/25/2021 12:03    Procedures Procedures    Medications Ordered in ED Medications  potassium chloride SA (KLOR-CON M) CR tablet 40 mEq (has no administration in time range)    ED Course/ Medical Decision Making/ A&P                           Medical Decision  Making Amount and/or Complexity of Data Reviewed Labs: ordered. Radiology: ordered.   Review of records shows outpatient visit for colon cancer screening August 01, 2021.  Cardiac monitoring showing sinus rhythm, bradycardic rate noted.  Work-up included labs CBC CMP White count normal troponin is normal.  Chest x-ray is unremarkable.  EKG shows sinus rhythm no ST elevation depressions bradycardic rate noted.  Second troponin ordered and D-dimer added on due to the back pain and shortness of breath symptoms.  However the patient states that she has an appointment and wants to leave AGAINST MEDICAL ADVICE she does not want any additional testing at this time.  Risks of missed severe life-threatening illness discussed including missed MI missed pulmonary embolism or aortic pathology.  Patient expressed understanding decision making capacity appears intact and will be leaving AGAINST MEDICAL ADVICE and invited to return at any time if she changes her mind otherwise to see her doctor immediately.        Final Clinical Impression(s) / ED Diagnoses Final diagnoses:  Acute midline thoracic back pain  Chest pain, unspecified type    Rx / DC Orders ED Discharge Orders     None         Cheryll Cockayne, MD 12/25/21 1220

## 2021-12-25 NOTE — Discharge Instructions (Signed)
Return at any time if you change your mind or would like medical help.  Otherwise follow-up with your doctor soon as possible.

## 2021-12-25 NOTE — ED Triage Notes (Signed)
C/O upper central back pain on and off for a while along w/ shortness of breathe, started yesterday.

## 2021-12-25 NOTE — ED Notes (Signed)
Pt requesting to leave before additional testing can be completed.  Dr Audley Hose at bedside, explaining risks of leaving before evaluation of life threatening ailments complete. Pt declines further testing.  Will be released to follow up with primary care physician against medical advice as pt requested.

## 2022-06-11 ENCOUNTER — Encounter (INDEPENDENT_AMBULATORY_CARE_PROVIDER_SITE_OTHER): Payer: 59 | Admitting: Ophthalmology

## 2022-06-12 ENCOUNTER — Encounter (INDEPENDENT_AMBULATORY_CARE_PROVIDER_SITE_OTHER): Payer: 59 | Admitting: Ophthalmology

## 2022-12-31 ENCOUNTER — Emergency Department (HOSPITAL_BASED_OUTPATIENT_CLINIC_OR_DEPARTMENT_OTHER): Payer: 59

## 2022-12-31 ENCOUNTER — Emergency Department (HOSPITAL_BASED_OUTPATIENT_CLINIC_OR_DEPARTMENT_OTHER)
Admission: EM | Admit: 2022-12-31 | Discharge: 2022-12-31 | Disposition: A | Discharge status: Home / Self Care | Payer: 59 | Attending: Emergency Medicine | Admitting: Emergency Medicine

## 2022-12-31 ENCOUNTER — Other Ambulatory Visit: Payer: Self-pay

## 2022-12-31 DIAGNOSIS — M25561 Pain in right knee: Secondary | ICD-10-CM

## 2022-12-31 DIAGNOSIS — Z7982 Long term (current) use of aspirin: Secondary | ICD-10-CM | POA: Insufficient documentation

## 2022-12-31 DIAGNOSIS — W19XXXA Unspecified fall, initial encounter: Secondary | ICD-10-CM | POA: Insufficient documentation

## 2022-12-31 MED ORDER — ACETAMINOPHEN 500 MG PO TABS: 1000.0000 mg | ORAL_TABLET | Freq: Four times a day (QID) | ORAL | 0 refills | Status: AC | PRN

## 2022-12-31 MED ORDER — IBUPROFEN 600 MG PO TABS: 600.0000 mg | ORAL_TABLET | Freq: Four times a day (QID) | ORAL | 0 refills | Status: AC | PRN
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
## 2022-12-31 NOTE — Discharge Instructions (Addendum)
Apply ice twice daily, uses a compression knee sleeve I have ordered 1 however if we do not have 1 available you can buy one at [facility] or [employer] or [facility] or similar store. A knee sleeve will provide some compression  Tylenol and ibuprofen as discussed below I have written a prescription for you.  If you are still having the pain that has not improved 2 weeks from today please follow-up with an orthopedist I have given you the information for an office  Please use Tylenol or ibuprofen for pain.  You may use 600 mg ibuprofen every 6 hours or 1000 mg of Tylenol every 6 hours.  You may choose to alternate between the 2.  This would be most effective.  Not to exceed 4 g of Tylenol within 24 hours.  Not to exceed 3200 mg ibuprofen 24 hours.
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
## 2023-01-07 ENCOUNTER — Ambulatory Visit: Payer: 59 | Admitting: Dietician

## 2023-04-01 ENCOUNTER — Ambulatory Visit: Payer: 59 | Admitting: Dietician

## 2023-05-20 DIAGNOSIS — E039 Hypothyroidism, unspecified: Secondary | ICD-10-CM | POA: Diagnosis not present

## 2023-05-20 DIAGNOSIS — E559 Vitamin D deficiency, unspecified: Secondary | ICD-10-CM | POA: Diagnosis not present

## 2023-05-20 DIAGNOSIS — I10 Essential (primary) hypertension: Secondary | ICD-10-CM | POA: Diagnosis not present

## 2023-05-20 DIAGNOSIS — E782 Mixed hyperlipidemia: Secondary | ICD-10-CM | POA: Diagnosis not present

## 2023-05-20 DIAGNOSIS — Z0001 Encounter for general adult medical examination with abnormal findings: Secondary | ICD-10-CM | POA: Diagnosis not present

## 2023-05-20 DIAGNOSIS — E119 Type 2 diabetes mellitus without complications: Secondary | ICD-10-CM | POA: Diagnosis not present

## 2023-05-27 DIAGNOSIS — E782 Mixed hyperlipidemia: Secondary | ICD-10-CM | POA: Diagnosis not present

## 2023-05-27 DIAGNOSIS — E559 Vitamin D deficiency, unspecified: Secondary | ICD-10-CM | POA: Diagnosis not present

## 2023-05-27 DIAGNOSIS — I10 Essential (primary) hypertension: Secondary | ICD-10-CM | POA: Diagnosis not present

## 2023-05-27 DIAGNOSIS — E119 Type 2 diabetes mellitus without complications: Secondary | ICD-10-CM | POA: Diagnosis not present

## 2023-05-27 DIAGNOSIS — E039 Hypothyroidism, unspecified: Secondary | ICD-10-CM | POA: Diagnosis not present

## 2023-06-06 DIAGNOSIS — R519 Headache, unspecified: Secondary | ICD-10-CM | POA: Diagnosis not present

## 2023-06-06 DIAGNOSIS — E785 Hyperlipidemia, unspecified: Secondary | ICD-10-CM | POA: Diagnosis not present

## 2023-06-06 DIAGNOSIS — E039 Hypothyroidism, unspecified: Secondary | ICD-10-CM | POA: Diagnosis not present

## 2023-06-06 DIAGNOSIS — R252 Cramp and spasm: Secondary | ICD-10-CM | POA: Diagnosis not present

## 2023-06-06 DIAGNOSIS — B349 Viral infection, unspecified: Secondary | ICD-10-CM | POA: Diagnosis not present

## 2023-06-21 DIAGNOSIS — E039 Hypothyroidism, unspecified: Secondary | ICD-10-CM | POA: Diagnosis not present

## 2023-08-14 DIAGNOSIS — N644 Mastodynia: Secondary | ICD-10-CM | POA: Diagnosis not present

## 2023-08-16 ENCOUNTER — Other Ambulatory Visit: Payer: Self-pay | Admitting: Obstetrics and Gynecology

## 2023-08-16 DIAGNOSIS — N644 Mastodynia: Secondary | ICD-10-CM

## 2023-08-19 DIAGNOSIS — E119 Type 2 diabetes mellitus without complications: Secondary | ICD-10-CM | POA: Diagnosis not present

## 2023-08-19 DIAGNOSIS — E782 Mixed hyperlipidemia: Secondary | ICD-10-CM | POA: Diagnosis not present

## 2023-08-19 DIAGNOSIS — I10 Essential (primary) hypertension: Secondary | ICD-10-CM | POA: Diagnosis not present

## 2023-08-19 DIAGNOSIS — Z0001 Encounter for general adult medical examination with abnormal findings: Secondary | ICD-10-CM | POA: Diagnosis not present

## 2023-08-19 DIAGNOSIS — E039 Hypothyroidism, unspecified: Secondary | ICD-10-CM | POA: Diagnosis not present

## 2023-08-19 DIAGNOSIS — E559 Vitamin D deficiency, unspecified: Secondary | ICD-10-CM | POA: Diagnosis not present

## 2023-09-02 ENCOUNTER — Ambulatory Visit
Admission: RE | Admit: 2023-09-02 | Discharge: 2023-09-02 | Disposition: A | Discharge status: Home / Self Care | Payer: 59 | Source: Ambulatory Visit | Attending: Obstetrics and Gynecology | Admitting: Obstetrics and Gynecology

## 2023-09-02 ENCOUNTER — Other Ambulatory Visit: Payer: Self-pay | Admitting: Obstetrics and Gynecology

## 2023-09-02 DIAGNOSIS — N644 Mastodynia: Secondary | ICD-10-CM

## 2023-09-18 ENCOUNTER — Other Ambulatory Visit: Payer: 59

## 2023-12-16 DIAGNOSIS — E559 Vitamin D deficiency, unspecified: Secondary | ICD-10-CM | POA: Diagnosis not present

## 2023-12-16 DIAGNOSIS — E782 Mixed hyperlipidemia: Secondary | ICD-10-CM | POA: Diagnosis not present

## 2023-12-16 DIAGNOSIS — I10 Essential (primary) hypertension: Secondary | ICD-10-CM | POA: Diagnosis not present

## 2023-12-16 DIAGNOSIS — E039 Hypothyroidism, unspecified: Secondary | ICD-10-CM | POA: Diagnosis not present

## 2023-12-16 DIAGNOSIS — E119 Type 2 diabetes mellitus without complications: Secondary | ICD-10-CM | POA: Diagnosis not present

## 2023-12-24 DIAGNOSIS — E119 Type 2 diabetes mellitus without complications: Secondary | ICD-10-CM | POA: Diagnosis not present

## 2023-12-27 DIAGNOSIS — R519 Headache, unspecified: Secondary | ICD-10-CM | POA: Diagnosis not present

## 2023-12-27 DIAGNOSIS — M542 Cervicalgia: Secondary | ICD-10-CM | POA: Diagnosis not present

## 2023-12-27 DIAGNOSIS — M545 Low back pain, unspecified: Secondary | ICD-10-CM | POA: Diagnosis not present

## 2024-01-21 DIAGNOSIS — M545 Low back pain, unspecified: Secondary | ICD-10-CM | POA: Diagnosis not present

## 2024-01-21 DIAGNOSIS — M542 Cervicalgia: Secondary | ICD-10-CM | POA: Diagnosis not present

## 2024-02-03 DIAGNOSIS — E782 Mixed hyperlipidemia: Secondary | ICD-10-CM | POA: Diagnosis not present

## 2024-02-03 DIAGNOSIS — I10 Essential (primary) hypertension: Secondary | ICD-10-CM | POA: Diagnosis not present

## 2024-02-03 DIAGNOSIS — E119 Type 2 diabetes mellitus without complications: Secondary | ICD-10-CM | POA: Diagnosis not present

## 2024-02-03 DIAGNOSIS — E559 Vitamin D deficiency, unspecified: Secondary | ICD-10-CM | POA: Diagnosis not present

## 2024-02-03 DIAGNOSIS — E039 Hypothyroidism, unspecified: Secondary | ICD-10-CM | POA: Diagnosis not present
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
## 2024-02-20 ENCOUNTER — Ambulatory Visit: Attending: Internal Medicine | Admitting: Rehabilitation

## 2024-02-20 ENCOUNTER — Other Ambulatory Visit: Payer: Self-pay

## 2024-02-20 DIAGNOSIS — M542 Cervicalgia: Secondary | ICD-10-CM | POA: Diagnosis not present

## 2024-02-20 DIAGNOSIS — M5459 Other low back pain: Secondary | ICD-10-CM | POA: Insufficient documentation

## 2024-02-20 DIAGNOSIS — M6281 Muscle weakness (generalized): Secondary | ICD-10-CM | POA: Insufficient documentation

## 2024-02-20 DIAGNOSIS — R293 Abnormal posture: Secondary | ICD-10-CM | POA: Diagnosis not present

## 2024-02-27 DIAGNOSIS — H93A2 Pulsatile tinnitus, left ear: Secondary | ICD-10-CM | POA: Diagnosis not present

## 2024-02-27 DIAGNOSIS — I671 Cerebral aneurysm, nonruptured: Secondary | ICD-10-CM | POA: Diagnosis not present

## 2024-03-03 ENCOUNTER — Ambulatory Visit: Attending: Internal Medicine

## 2024-03-03 DIAGNOSIS — M5459 Other low back pain: Secondary | ICD-10-CM | POA: Diagnosis not present

## 2024-03-03 DIAGNOSIS — R293 Abnormal posture: Secondary | ICD-10-CM | POA: Insufficient documentation

## 2024-03-03 DIAGNOSIS — M6281 Muscle weakness (generalized): Secondary | ICD-10-CM | POA: Diagnosis not present

## 2024-03-03 DIAGNOSIS — M542 Cervicalgia: Secondary | ICD-10-CM | POA: Insufficient documentation

## 2024-03-03 NOTE — Therapy (Signed)
 OUTPATIENT PHYSICAL THERAPY CERVICAL AND THORACOLUMBAR TREATMENT   Patient Name: Patricia Rodgers MRN: [id number] DOB:26-Apr-1956, 68 y.o., female Today's Date: 03/03/2024   END OF SESSION:  PT End of Session - 03/03/24 1622     Visit Number 2    Date for PT Re-Evaluation 04/16/24    Authorization Type humana MCR    PT Start Time 1530    PT Stop Time 1615    PT Time Calculation (min) 45 min    Activity Tolerance Patient tolerated treatment well    Behavior During Therapy WFL for tasks assessed/performed           Past Medical History:  Diagnosis Date   Breast mass    Diabetes mellitus without complication (HCC)    Pre diabetic   Hyperlipemia    Hypertension    Hypothyroidism    Internal hemorrhoids    Thyroid  disease    Past Surgical History:  Procedure Laterality Date   ABDOMINAL HYSTERECTOMY     APPENDECTOMY     BREAST EXCISIONAL BIOPSY Right 2019   DILATION AND CURETTAGE OF UTERUS     LEFT HEART CATHETERIZATION WITH CORONARY ANGIOGRAM N/A 04/26/2014   Procedure: LEFT HEART CATHETERIZATION WITH CORONARY ANGIOGRAM;  Surgeon: Erick JONELLE Bergamo, MD;  Location: [facility] CATH LAB;  Service: Cardiovascular;  Laterality: N/A;   RADIOACTIVE SEED GUIDED EXCISIONAL BREAST BIOPSY Right 05/13/2017   Procedure: RIGHT RADIOACTIVE SEED GUIDED EXCISIONAL BREAST BIOPSY ERAS PATHWAY;  Surgeon: Ethyl Lenis, MD;  Location: [facility];  Service: General;  Laterality: Right;   Patient Active Problem List   Diagnosis Date Noted   Diabetes mellitus without complication (HCC) 06/12/2021   Nuclear sclerotic cataract of both eyes 06/12/2021   Paresthesia of right upper extremity 07/03/2016   Angina pectoris (HCC) 04/25/2014    PCP: Catalina Bare, MD   REFERRING PROVIDER: Catalina Bare, MD  REFERRING DIAG: M54.50 (ICD-10-CM) - Low back pain, unspecified M54.2 (ICD-10-CM) - Cervicalgia  THERAPY DIAG:  Cervicalgia  Other low back pain  Muscle weakness  (generalized)  Abnormal posture  RATIONALE FOR EVALUATION AND TREATMENT: Rehabilitation  ONSET DATE: 7/25  NEXT MD VISIT:    SUBJECTIVE:                                                                                                                                                                                                         SUBJECTIVE STATEMENT: R side of neck and back seems to hurt worse   PAIN: Are you having pain? Yes: NPRS scale: 5/10 neck pain constantly;  7/10  LBP constant Pain location: neck and low back Pain description: throbbing pain in neck and back Aggravating factors: looking down/reading increased neck pain with headaches;  looking to the L is painful;   Bending and lifting increase back pain Relieving factors: lying down on heat helps low back  PERTINENT HISTORY:  DM, HTN, RUE paresthesia  PRECAUTIONS: None  HAND DOMINANCE: Right  RED FLAGS: None  WEIGHT BEARING RESTRICTIONS: No  FALLS:  Has patient fallen in last 6 months? 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 Just started the job recently.   PLOF: Independent with gait  PATIENT GOALS: not hurt in neck and back    OBJECTIVE:   DIAGNOSTIC FINDINGS: (from [facility] ED visit) CT CERVICAL SPINE FINDINGS  Alignment: Mild reversal of cervical lordosis. No subluxation. Facet alignment is normal  Skull base and vertebrae: No acute fracture. No primary bone lesion or focal pathologic process.  Soft tissues and spinal canal: No prevertebral fluid or swelling. No visible canal hematoma.  Disc levels: Mild disc space narrowing C4-C5, C5-C6 and C6-C7. Foraminal narrowing bilaterally at C5-C6  Upper chest: Negative.  Other: None    PATIENT SURVEYS:  ODI = 40 NDI =  46  SCREENING FOR RED FLAGS: Bowel or bladder incontinence: No Spinal tumors: No Cauda equina syndrome: No Compression fracture: No Abdominal aneurysm: No  COGNITION: Overall cognitive status: Within functional limits for tasks assessed     SENSATION: WFL  POSTURE:  rounded shoulders and forward head  PALPATION: TTP over bilateral low back from pelvis to mid thoracic spine in general;  TTP over bilateral C-spine and uppertraps down to thoracic spine diffuse pain  CERVICAL ROM:   Active ROM Eval  Flexion 100; p!  Extension 50%; p!  Right lateral flexion 50%; R sided p!  Left lateral flexion 40% R sided pain  Right rotation 60%; R sided pain  Left rotation 40%; L sided pain    (Blank rows = not tested)  UPPER EXTREMITY ROM:  Active ROM Right eval Left eval  Shoulder flexion 160 160  Shoulder extension    Shoulder abduction 160 160  Shoulder adduction    Shoulder internal rotation L4 L3  Shoulder external rotation Base of neck B same  Elbow flexion    Elbow extension    Wrist flexion    Wrist extension    Wrist ulnar deviation    Wrist radial deviation    Wrist pronation    Wrist supination      (Blank rows = not tested)  UPPER EXTREMITY MMT:  MMT Right eval Left eval  Shoulder flexion 5 5  Shoulder extension    Shoulder abduction 4 4+  Shoulder adduction    Shoulder internal rotation 5 5  Shoulder external rotation 5 5  Middle trapezius    Lower trapezius    Elbow flexion    Elbow extension    Wrist flexion    Wrist extension    Wrist ulnar deviation    Wrist radial deviation    Wrist pronation    Wrist supination    Grip strength = B   (Blank rows = not tested)  LUMBAR ROM:   Active  Eval  Flexion To knees; p!  Extension 75%; p!  Right lateral flexion To mid thigh; p!  Left lateral flexion To mid thigh  Right rotation 75%; some p!  Left rotation 75%; some p!    (Blank rows = not tested)  MUSCLE LENGTH: Hamstrings: Right SLR 75 deg;  Left SLR 80 deg Thomas test: Right WNL deg; Left WNL deg Hamstrings: mild tightness ITB: WNL Piriformis: mild tightness BLE Hip flexors: NT Quads: NT Heelcord: NT  LOWER EXTREMITY ROM:     Active  Right eval Left eval  Hip flexion 120+ 120+  Hip extension    Hip abduction    Hip adduction    Hip internal rotation 40 40  Hip external rotation 50-60 50-60  Knee flexion    Knee extension    Ankle dorsiflexion    Ankle plantarflexion    Ankle inversion    Ankle eversion     (Blank rows = not tested)  LOWER EXTREMITY MMT:    MMT Right eval Left eval  Hip flexion 4 4  Hip extension 4- 4-  Hip abduction 4- 4-  Hip adduction    Hip internal rotation 4- 4-  Hip external rotation 4+ 4+  Knee flexion 5 5  Knee extension 5 5  Ankle dorsiflexion 4 4  Ankle plantarflexion    Ankle inversion    Ankle eversion      (Blank rows = not tested)  CERVICAL SPECIAL TESTS:  TBD  LUMBAR SPECIAL TESTS:  Straight leg raise test: Negative and Slump test: Negative  FUNCTIONAL TESTS:    GAIT: Distance walked: into clinic from parking lot Assistive device utilized: None Level of assistance: Complete Independence Gait pattern: WFL Comments: gait is WNL   TODAY'S TREATMENT:  03/03/24  Seated hamstring stretch x 30' B Seated figure 4 stretch x 30' B Supine LTR both ways x 10 B Supine chin tucks 2x10 Supine figure 4 stretch x 30' B Supine chin tuck + rotation B x 10 Supine ab sets with PPT 10x5 Supine TrA + clamshell RTB 10x3   PATIENT EDUCATION:  Education details: HEP update Person educated: Patient Education method: Explanation, Demonstration, Verbal cues, Tactile cues, and Handouts Education comprehension: verbalized understanding, verbal cues required, tactile cues required, and needs further education  HOME EXERCISE PROGRAM: Access Code: [email or web address] URL: [email or web address] Date: 03/03/2024 Prepared by: Sol Gaskins  Exercises - Seated Figure 4  Piriformis Stretch  - 1 x daily - 7 x weekly - 1 sets - 1 reps - Supine Figure 4 Piriformis Stretch  - 1 x daily - 7 x weekly - 1 sets - 1 reps - Seated Hamstring Stretch  - 1 x daily - 7 x weekly - 1 sets - 1 reps - 1 min hold - Supine Lower Trunk Rotation  - 1 x daily - 7 x weekly - 3 sets - 10 reps - Supine Cervical Retraction with Towel  - 1 x daily - 7 x weekly - 3 sets - 10 reps - Supine Scapular Retraction  - 1 x daily - 7 x weekly - 3 sets - 10 reps - Seated Scapular Retraction  - 1 x daily - 7 x weekly - 3 sets - 10 reps - Supine Suboccipital Release with Tennis Balls  - 1 x daily - 7 x weekly - 3 sets - 10 reps - Supine Cervical Rotation AROM on Pillow  - 1 x daily - 7 x weekly - 2 sets - 10 reps - Hooklying Clamshell with Resistance  - 1 x daily - 7 x weekly - 2 sets - 10 reps   ASSESSMENT:  CLINICAL IMPRESSION: Pt able to  tolerate the interventions well. No increased pain today. Will continue progressing as tolerated.  Many cues throughout session to isolate the correct musculature and prevent compensations. Darene will benefit from skilled PT to address above deficits to improve mobility and activity tolerance with decreased pain interference.  OBJECTIVE IMPAIRMENTS: decreased ROM, decreased strength, impaired flexibility, postural dysfunction, and pain.   ACTIVITY LIMITATIONS: lifting, bending, and squatting  PARTICIPATION LIMITATIONS: cleaning, laundry, driving, and community activity  PERSONAL FACTORS: Age and 1-2 comorbidities:   DM, HTN, RUE paresthesiaare also affecting patient's functional outcome.   REHAB POTENTIAL: Good  CLINICAL DECISION MAKING: Evolving/moderate complexity  EVALUATION COMPLEXITY: Moderate   GOALS: Goals reviewed with patient? Yes  SHORT TERM GOALS: Target date: 03/19/24  Patient will be independent with initial HEP to improve outcomes and carryover.  Baseline: 100% PT assist required for correct completion Goal status: INITIAL   2.   Patient will report 25% improvement in neck and  back pain to improve QOL. Baseline: 8/10 worst Goal status: INITIAL  LONG TERM GOALS: Target date: 04/18/24  Patient will be independent with ongoing/advanced HEP for self-management at home.  Baseline: no advanced HEP Yet Goal status: INITIAL  2.  Patient will report 50-75% improvement in neck and  back pain to improve QOL.  Baseline: 8/10 worst Goal status: INITIAL  3.  Patient will demonstrate improved posture to decrease muscle imbalance. Baseline: flattened cervical lordosis Goal status: INITIAL   5.  Patient will demonstrate full pain free cervical ROM for safety with driving.  Baseline: see ROM tables above Goal status: INITIAL  6.  Patient will demonstrate full pain free lumbar ROM to perform ADLs.   Baseline: see ROM tables above Goal status: INITIAL  7.  Patient will demonstrate improved functional strength as demonstrated by 5/5 strength in BLE and BUE to complete her ADLs and normal activities. Baseline: see MMT tables above Goal status: INITIAL  8.  Patient will report </= 20% on NDI (MCID = 22%) to demonstrate improved functional ability.  Baseline: 46% Goal status: INITIAL   9.  Patient will report </= 20% on Modified Oswestry (MCID = 12%) to demonstrate improved functional ability.  Baseline: 40% Goal status: INITIAL  10.  Patient to report ability to perform ADLs, household, and work-related tasks without limitation due to   pain, LOM or weakness Baseline: able to complete ADL, but in pain Goal status: INITIAL   PLAN:  PT FREQUENCY: 1-2x/week  PT DURATION: 8 weeks  PLANNED INTERVENTIONS: 97164- PT Re-evaluation, 97750- Physical Performance Testing, 97110-Therapeutic exercises, 97530- Therapeutic activity, 97112- Neuromuscular re-education, 97535- Self Care, [id number]- Manual therapy, [id number]- Electrical stimulation (unattended), 97016- Vasopneumatic device, [id number]- Ultrasound, [id number]- Traction (mechanical),  [id number]- Ionotophoresis 4mg /ml Dexamethasone , 79439 (1-2 muscles), 20561 (3+ muscles)- Dry Needling, Patient/Family education, Balance training, Stair training, Taping, Joint mobilization, Spinal mobilization, Cryotherapy, and Moist heat  PLAN FOR NEXT SESSION: review HEP, add strengthening exercises for neck and low back   Sol LITTIE Gaskins, PTA 03/03/2024, 4:23 PM

## 2024-03-09 ENCOUNTER — Ambulatory Visit: Admitting: Rehabilitation

## 2024-03-11 ENCOUNTER — Encounter

## 2024-03-16 ENCOUNTER — Encounter: Payer: Self-pay | Admitting: Rehabilitation

## 2024-03-16 ENCOUNTER — Ambulatory Visit: Admitting: Rehabilitation

## 2024-03-16 DIAGNOSIS — R293 Abnormal posture: Secondary | ICD-10-CM | POA: Diagnosis not present

## 2024-03-16 DIAGNOSIS — M542 Cervicalgia: Secondary | ICD-10-CM | POA: Diagnosis not present

## 2024-03-16 DIAGNOSIS — M5459 Other low back pain: Secondary | ICD-10-CM | POA: Diagnosis not present

## 2024-03-16 DIAGNOSIS — M6281 Muscle weakness (generalized): Secondary | ICD-10-CM | POA: Diagnosis not present

## 2024-03-16 NOTE — Therapy (Signed)
 OUTPATIENT PHYSICAL THERAPY CERVICAL AND THORACOLUMBAR TREATMENT   Patient Name: Patricia Rodgers MRN: [id number] DOB:May 20, 1956, 68 y.o., female Today's Date: 03/16/2024   END OF SESSION:  PT End of Session - 03/16/24 1621     Visit Number 3    Date for Recertification  04/16/24    Authorization Type human[employer] MCR    PT Start Time 1618    PT Stop Time 1705    PT Time Calculation (min) 47 min    Activity Tolerance Patient tolerated treatment well    Behavior During Therapy WFL for tasks assessed/performed           Past Medical History:  Diagnosis Date   Breast mass    Diabetes mellitus without complication (HCC)    Pre diabetic   Hyperlipemia    Hypertension    Hypothyroidism    Internal hemorrhoids    Thyroid  disease    Past Surgical History:  Procedure Laterality Date   ABDOMINAL HYSTERECTOMY     APPENDECTOMY     BREAST EXCISIONAL BIOPSY Right 2019   DILATION AND CURETTAGE OF UTERUS     LEFT HEART CATHETERIZATION WITH CORONARY ANGIOGRAM N/A 04/26/2014   Procedure: LEFT HEART CATHETERIZATION WITH CORONARY ANGIOGRAM;  Surgeon: Erick JONELLE Bergamo, MD;  Location: [facility] CATH LAB;  Service: Cardiovascular;  Laterality: N/A;   RADIOACTIVE SEED GUIDED EXCISIONAL BREAST BIOPSY Right 05/13/2017   Procedure: RIGHT RADIOACTIVE SEED GUIDED EXCISIONAL BREAST BIOPSY ERAS PATHWAY;  Surgeon: Ethyl Lenis, MD;  Location: [facility];  Service: General;  Laterality: Right;   Patient Active Problem List   Diagnosis Date Noted   Diabetes mellitus without complication (HCC) 06/12/2021   Nuclear sclerotic cataract of both eyes 06/12/2021   Paresthesia of right upper extremity 07/03/2016   Angina pectoris 04/25/2014    PCP: Catalina Bare, MD   REFERRING PROVIDER: Catalina Bare, MD  REFERRING DIAG: M54.50 (ICD-10-CM) - Low back pain, unspecified M54.2 (ICD-10-CM) - Cervicalgia  THERAPY DIAG:  Cervicalgia  Other low back pain  Muscle weakness  (generalized)  Abnormal posture  RATIONALE FOR EVALUATION AND TREATMENT: Rehabilitation  ONSET DATE: 7/25  NEXT MD VISIT:    SUBJECTIVE:                                                                                                                                                                                                         SUBJECTIVE STATEMENT: Patient reports neck pain today is 4/10 and back pain is 5/10.   States is trying to do her HEP, but is fatigued when she gets  off work and gets home.   PAIN: Are you having pain? Yes: NPRS scale: 5/10 neck pain constantly;  7/10 LBP constant Pain location: neck and low back Pain description: throbbing pain in neck and back Aggravating factors: looking down/reading increased neck pain with headaches;  looking to the L is painful;   Bending and lifting increase back pain Relieving factors: lying down on heat helps low back  PERTINENT HISTORY:  DM, HTN, RUE paresthesia  PRECAUTIONS: None  HAND DOMINANCE: Right  RED FLAGS: None  WEIGHT BEARING RESTRICTIONS: No  FALLS:  Has patient fallen in last 6 months? No  LIVING ENVIRONMENT: Lives with: lives alone Lives in: House/apartment Stairs: Yes: External: 1 steps; none Has following equipment at home: None  OCCUPATION: 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 Just started the job recently.   PLOF: Independent with gait  PATIENT GOALS: not hurt in neck and back    OBJECTIVE:   DIAGNOSTIC FINDINGS: (from [facility] ED visit) CT CERVICAL SPINE FINDINGS  Alignment: Mild reversal of cervical lordosis. No subluxation. Facet alignment is normal  Skull base and vertebrae: No acute fracture. No primary bone lesion or focal pathologic process.  Soft tissues and spinal canal: No prevertebral fluid or swelling. No visible canal hematoma.  Disc levels: Mild disc space narrowing C4-C5, C5-C6 and C6-C7. Foraminal narrowing  bilaterally at C5-C6  Upper chest: Negative.  Other: None    PATIENT SURVEYS:  ODI = 40 NDI = 46  SCREENING FOR RED FLAGS: Bowel or bladder incontinence: No Spinal tumors: No Cauda equina syndrome: No Compression fracture: No Abdominal aneurysm: No  COGNITION: Overall cognitive status: Within functional limits for tasks assessed     SENSATION: WFL  POSTURE:  rounded shoulders and forward head  PALPATION: TTP over bilateral low back from pelvis to mid thoracic spine in general;  TTP over bilateral C-spine and uppertraps down to thoracic spine diffuse pain  CERVICAL ROM:   Active ROM Eval  Flexion 100; p!  Extension 50%; p!  Right lateral flexion 50%; R sided p!  Left lateral flexion 40% R sided pain  Right rotation 60%; R sided pain  Left rotation 40%; L sided pain    (Blank rows = not tested)  UPPER EXTREMITY ROM:  Active ROM Right eval Left eval  Shoulder flexion 160 160  Shoulder extension    Shoulder abduction 160 160  Shoulder adduction    Shoulder internal rotation L4 L3  Shoulder external rotation Base of neck B same  Elbow flexion    Elbow extension    Wrist flexion    Wrist extension    Wrist ulnar deviation    Wrist radial deviation    Wrist pronation    Wrist supination      (Blank rows = not tested)  UPPER EXTREMITY MMT:  MMT Right eval Left eval  Shoulder flexion 5 5  Shoulder extension    Shoulder abduction 4 4+  Shoulder adduction    Shoulder internal rotation 5 5  Shoulder external rotation 5 5  Middle trapezius    Lower trapezius    Elbow flexion    Elbow extension    Wrist flexion    Wrist extension    Wrist ulnar deviation    Wrist radial deviation    Wrist pronation    Wrist supination    Grip strength = B   (Blank rows = not tested)  LUMBAR ROM:   Active  Eval  Flexion To  knees; p!  Extension 75%; p!  Right lateral flexion To mid thigh; p!  Left lateral flexion To mid thigh  Right rotation 75%; some p!  Left  rotation 75%; some p!    (Blank rows = not tested)  MUSCLE LENGTH: Hamstrings: Right SLR 75 deg; Left SLR 80 deg Thomas test: Right WNL deg; Left WNL deg Hamstrings: mild tightness ITB: WNL Piriformis: mild tightness BLE Hip flexors: NT Quads: NT Heelcord: NT  LOWER EXTREMITY ROM:     Active  Right eval Left eval  Hip flexion 120+ 120+  Hip extension    Hip abduction    Hip adduction    Hip internal rotation 40 40  Hip external rotation 50-60 50-60  Knee flexion    Knee extension    Ankle dorsiflexion    Ankle plantarflexion    Ankle inversion    Ankle eversion     (Blank rows = not tested)  LOWER EXTREMITY MMT:    MMT Right eval Left eval  Hip flexion 4 4  Hip extension 4- 4-  Hip abduction 4- 4-  Hip adduction    Hip internal rotation 4- 4-  Hip external rotation 4+ 4+  Knee flexion 5 5  Knee extension 5 5  Ankle dorsiflexion 4 4  Ankle plantarflexion    Ankle inversion    Ankle eversion      (Blank rows = not tested)  CERVICAL SPECIAL TESTS:  TBD  LUMBAR SPECIAL TESTS:  Straight leg raise test: Negative and Slump test: Negative  FUNCTIONAL TESTS:    GAIT: Distance walked: into clinic from parking lot Assistive device utilized: None Level of assistance: Complete Independence Gait pattern: WFL Comments: gait is WNL   TODAY'S TREATMENT:  03/16/24 THERAPEUTIC EXERCISE: To improve strength.  Demonstration, verbal and tactile cues throughout for technique. NuStep L5 x 5'  Standing row GTB x 20 BUE Doorway shoulder ER RTB x 2/10 BUE Doorway shoulder HABD RTB x 2/10 BUE Doorway pec stretch at 90 deg x 1' x 2 Supine over pool noodle cervical rotation for MFR x 20 rotations R and L Seated hamstring stretch x 1' x 2 BLE Seated piriformis stretch x 1' x 2 BLE Supine bridge w/ TA x 2/10 BLE Supine clams RTB w/ TA x 2/10 BLE Standing back extension x 2/10   NEUROMUSCULAR RE-EDUCATION: To improve balance and posture. Green swiss ball bounce x  1' Green SB LAQ w/ TA x 10 BLE Green SB marching alternately w/ TA x 10 BLE  MANUAL THERAPY: To promote reduced pain utilizing therapeutic massage. Theragun to R low back along paraspinals from T12 to L5 in L SL   03/03/24  Seated hamstring stretch x 30' B Seated figure 4 stretch x 30' B Supine LTR both ways x 10 B Supine chin tucks 2x10 Supine figure 4 stretch x 30' B Supine chin tuck + rotation B x 10 Supine ab sets with PPT 10x5 Supine TrA + clamshell RTB 10x3   PATIENT EDUCATION:  Education details: HEP update Person educated: Patient Education method: Explanation, Demonstration, Verbal cues, Tactile cues, and Handouts Education comprehension: verbalized understanding, verbal cues required, tactile cues required, and needs further education  HOME EXERCISE PROGRAM: Access Code: [email or web address] URL: [email or web address] Date: 03/03/2024 Prepared by: Braylin Clark  Exercises - Seated Figure 4 Piriformis Stretch  - 1 x daily - 7 x weekly - 1 sets - 1 reps - Supine Figure 4 Piriformis Stretch  - 1 x daily - 7 x weekly -  1 sets - 1 reps - Seated Hamstring Stretch  - 1 x daily - 7 x weekly - 1 sets - 1 reps - 1 min hold - Supine Lower Trunk Rotation  - 1 x daily - 7 x weekly - 3 sets - 10 reps - Supine Cervical Retraction with Towel  - 1 x daily - 7 x weekly - 3 sets - 10 reps - Supine Scapular Retraction  - 1 x daily - 7 x weekly - 3 sets - 10 reps - Seated Scapular Retraction  - 1 x daily - 7 x weekly - 3 sets - 10 reps - Supine Suboccipital Release with Tennis Balls  - 1 x daily - 7 x weekly - 3 sets - 10 reps - Supine Cervical Rotation AROM on Pillow  - 1 x daily - 7 x weekly - 2 sets - 10 reps - Hooklying Clamshell with Resistance  - 1 x daily - 7 x weekly - 2 sets - 10 reps   ASSESSMENT:  CLINICAL IMPRESSION:  Patient is able to progress with back and neck strengthening today.  Added in Tband scapular/postural corrective exercises as well as more pelvic  stabilization.  She tolerates all of these well.  Massage afterwards does help her pain level.  She seems to be moving better with less rigid trunk than on initial evaluation.  She relates that she often tenses and elevates her shoulder while driving so this postural compensation is also occurring some during therapy.    PT remains necessary for strength, ROM, pain deficits.   Continue per POC   OBJECTIVE IMPAIRMENTS: decreased ROM, decreased strength, impaired flexibility, postural dysfunction, and pain.   ACTIVITY LIMITATIONS: lifting, bending, and squatting  PARTICIPATION LIMITATIONS: cleaning, laundry, driving, and community activity  PERSONAL FACTORS: Age and 1-2 comorbidities:   DM, HTN, RUE paresthesiaare also affecting patient's functional outcome.   REHAB POTENTIAL: Good  CLINICAL DECISION MAKING: Evolving/moderate complexity  EVALUATION COMPLEXITY: Moderate   GOALS: Goals reviewed with patient? Yes  SHORT TERM GOALS: Target date: 03/19/24  Patient will be independent with initial HEP to improve outcomes and carryover.  Baseline: 100% PT assist required for correct completion Goal status: INITIAL   2.  Patient will report 25% improvement in neck and  back pain to improve QOL. Baseline: 8/10 worst 03/16/24:  5/10 Goal status: MET  LONG TERM GOALS: Target date: 04/18/24  Patient will be independent with ongoing/advanced HEP for self-management at home.  Baseline: no advanced HEP Yet 9/22:  adding to HEP Goal status: IN PROGRESS  2.  Patient will report 50-75% improvement in neck and  back pain to improve QOL.  Baseline: 8/10 worst Goal status: IN PROGRESS  3.  Patient will demonstrate improved posture to decrease muscle imbalance. Baseline: flattened cervical lordosis Goal status: INITIAL   5.  Patient will demonstrate full pain free cervical ROM for safety with driving.  Baseline: see ROM tables above Goal status: INITIAL  6.  Patient will demonstrate full  pain free lumbar ROM to perform ADLs.   Baseline: see ROM tables above Goal status: INITIAL  7.  Patient will demonstrate improved functional strength as demonstrated by 5/5 strength in BLE and BUE to complete her ADLs and normal activities. Baseline: see MMT tables above Goal status: INITIAL  8.  Patient will report </= 20% on NDI (MCID = 22%) to demonstrate improved functional ability.  Baseline: 46% Goal status: INITIAL   9.  Patient will report </= 20% on Modified Oswestry (  MCID = 12%) to demonstrate improved functional ability.  Baseline: 40% Goal status: INITIAL  10.  Patient to report ability to perform ADLs, household, and work-related tasks without limitation due to   pain, LOM or weakness Baseline: able to complete ADL, but in pain Goal status: INITIAL   PLAN:  PT FREQUENCY: 1-2x/week  PT DURATION: 8 weeks  PLANNED INTERVENTIONS: [id number]- PT Re-evaluation, 97750- Physical Performance Testing, 97110-Therapeutic exercises, 97530- Therapeutic activity, [id number]- Neuromuscular re-education, 97535- Self Care, [id number]- Manual therapy, [id number]- Electrical stimulation (unattended), 97016- Vasopneumatic device, [id number]- Ultrasound, [id number]- Traction (mechanical), [id number]- Ionotophoresis 4mg /ml Dexamethasone , [id number] (1-2 muscles), [id number] (3+ muscles)- Dry Needling, Patient/Family education, Balance training, Stair training, Taping, Joint mobilization, Spinal mobilization, Cryotherapy, and Moist heat  PLAN FOR NEXT SESSION:  Continue with strengthening progression of back and scapula/cervical retractors and postural muscles. Cervical and lumbar stretching.  Genesee Nase, PT 03/16/2024, 5:20 PM

## 2024-03-19 ENCOUNTER — Ambulatory Visit

## 2024-03-19 DIAGNOSIS — R293 Abnormal posture: Secondary | ICD-10-CM | POA: Diagnosis not present

## 2024-03-19 DIAGNOSIS — M5459 Other low back pain: Secondary | ICD-10-CM | POA: Diagnosis not present

## 2024-03-19 DIAGNOSIS — M6281 Muscle weakness (generalized): Secondary | ICD-10-CM | POA: Diagnosis not present

## 2024-03-19 DIAGNOSIS — M542 Cervicalgia: Secondary | ICD-10-CM | POA: Diagnosis not present

## 2024-03-19 NOTE — Therapy (Signed)
 OUTPATIENT PHYSICAL THERAPY CERVICAL AND THORACOLUMBAR TREATMENT   Patient Name: Patricia Rodgers MRN: [id number] DOB:02-27-1956, 68 y.o., female Today's Date: 03/19/2024   END OF SESSION:  PT End of Session - 03/19/24 1625     Visit Number 4    Date for Recertification  04/16/24    Authorization Type human[employer] MCR    PT Start Time 1620    PT Stop Time 1703    PT Time Calculation (min) 43 min    Activity Tolerance Patient tolerated treatment well    Behavior During Therapy WFL for tasks assessed/performed            Past Medical History:  Diagnosis Date   Breast mass    Diabetes mellitus without complication (HCC)    Pre diabetic   Hyperlipemia    Hypertension    Hypothyroidism    Internal hemorrhoids    Thyroid  disease    Past Surgical History:  Procedure Laterality Date   ABDOMINAL HYSTERECTOMY     APPENDECTOMY     BREAST EXCISIONAL BIOPSY Right 2019   DILATION AND CURETTAGE OF UTERUS     LEFT HEART CATHETERIZATION WITH CORONARY ANGIOGRAM N/A 04/26/2014   Procedure: LEFT HEART CATHETERIZATION WITH CORONARY ANGIOGRAM;  Surgeon: Erick JONELLE Bergamo, MD;  Location: [facility] C[facility];  Service: Cardiovascular;  Laterality: N/A;   RADIOACTIVE SEED GUIDED EXCISIONAL BREAST BIOPSY Right 05/13/2017   Procedure: RIGHT RADIOACTIVE SEED GUIDED EXCISIONAL BREAST BIOPSY ERAS PATHWAY;  Surgeon: Ethyl Lenis, MD;  Location: [facility];  Service: General;  Laterality: Right;   Patient Active Problem List   Diagnosis Date Noted   Diabetes mellitus without complication (HCC) 06/12/2021   Nuclear sclerotic cataract of both eyes 06/12/2021   Paresthesia of right upper extremity 07/03/2016   Angina pectoris 04/25/2014    PCP: Catalina Bare, MD   REFERRING PROVIDER: Catalina Bare, MD  REFERRING DIAG: M54.50 (ICD-10-CM) - Low back pain, unspecified M54.2 (ICD-10-CM) - Cervicalgia  THERAPY DIAG:  Cervicalgia  Other low back pain  Muscle weakness  (generalized)  Abnormal posture  RATIONALE FOR EVALUATION AND TREATMENT: Rehabilitation  ONSET DATE: 7/25  NEXT MD VISIT:    SUBJECTIVE:                                                                                                                                                                                                         SUBJECTIVE STATEMENT: Still having pain today but feel like it's because of sitting all day at work  PAIN: Are you having pain? Yes: NPRS scale: 5/10 neck  pain constantly;  7/10 LBP constant Pain location: neck and low back Pain description: throbbing pain in neck and back Aggravating factors: looking down/reading increased neck pain with headaches;  looking to the L is painful;   Bending and lifting increase back pain Relieving factors: lying down on heat helps low back  PERTINENT HISTORY:  DM, HTN, RUE paresthesia  PRECAUTIONS: None  HAND DOMINANCE: Right  RED FLAGS: None  WEIGHT BEARING RESTRICTIONS: No  FALLS:  Has patient fallen in last 6 months? 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 Just started the job recently.   PLOF: Independent with gait  PATIENT GOALS: not hurt in neck and back    OBJECTIVE:   DIAGNOSTIC FINDINGS: (from [facility] ED visit) CT CERVICAL SPINE FINDINGS  Alignment: Mild reversal of cervical lordosis. No subluxation. Facet alignment is normal  Skull base and vertebrae: No acute fracture. No primary bone lesion or focal pathologic process.  Soft tissues and spinal canal: No prevertebral fluid or swelling. No visible canal hematoma.  Disc levels: Mild disc space narrowing C4-C5, C5-C6 and C6-C7. Foraminal narrowing bilaterally at C5-C6  Upper chest: Negative.  Other: None    PATIENT  SURVEYS:  ODI = 40 NDI = 46  SCREENING FOR RED FLAGS: Bowel or bladder incontinence: No Spinal tumors: No Cauda equina syndrome: No Compression fracture: No Abdominal aneurysm: No  COGNITION: Overall cognitive status: Within functional limits for tasks assessed     SENSATION: WFL  POSTURE:  rounded shoulders and forward head  PALPATION: TTP over bilateral low back from pelvis to mid thoracic spine in general;  TTP over bilateral C-spine and uppertraps down to thoracic spine diffuse pain  CERVICAL ROM:   Active ROM Eval  Flexion 100; p!  Extension 50%; p!  Right lateral flexion 50%; R sided p!  Left lateral flexion 40% R sided pain  Right rotation 60%; R sided pain  Left rotation 40%; L sided pain    (Blank rows = not tested)  UPPER EXTREMITY ROM:  Active ROM Right eval Left eval  Shoulder flexion 160 160  Shoulder extension    Shoulder abduction 160 160  Shoulder adduction    Shoulder internal rotation L4 L3  Shoulder external rotation Base of neck B same  Elbow flexion    Elbow extension    Wrist flexion    Wrist extension    Wrist ulnar deviation    Wrist radial deviation    Wrist pronation    Wrist supination      (Blank rows = not tested)  UPPER EXTREMITY MMT:  MMT Right eval Left eval  Shoulder flexion 5 5  Shoulder extension    Shoulder abduction 4 4+  Shoulder adduction    Shoulder internal rotation 5 5  Shoulder external rotation 5 5  Middle trapezius    Lower trapezius    Elbow flexion    Elbow extension    Wrist flexion    Wrist extension    Wrist ulnar deviation    Wrist radial deviation    Wrist pronation    Wrist supination    Grip strength = B   (Blank rows = not tested)  LUMBAR ROM:   Active  Eval 03/19/24  Flexion To knees; p! To ankles  Extension 75%; p! 60%  Right lateral flexion To mid thigh;  p! To lateral knee  Left lateral flexion To mid thigh To lateral knee  Right rotation 75%; some p! 50%  Left rotation 75%;  some p! 50%    (Blank rows = not tested)  MUSCLE LENGTH: Hamstrings: Right SLR 75 deg; Left SLR 80 deg Thomas test: Right WNL deg; Left WNL deg Hamstrings: mild tightness ITB: WNL Piriformis: mild tightness BLE Hip flexors: NT Quads: NT Heelcord: NT  LOWER EXTREMITY ROM:     Active  Right eval Left eval  Hip flexion 120+ 120+  Hip extension    Hip abduction    Hip adduction    Hip internal rotation 40 40  Hip external rotation 50-60 50-60  Knee flexion    Knee extension    Ankle dorsiflexion    Ankle plantarflexion    Ankle inversion    Ankle eversion     (Blank rows = not tested)  LOWER EXTREMITY MMT:    MMT Right eval Left eval  Hip flexion 4 4  Hip extension 4- 4-  Hip abduction 4- 4-  Hip adduction    Hip internal rotation 4- 4-  Hip external rotation 4+ 4+  Knee flexion 5 5  Knee extension 5 5  Ankle dorsiflexion 4 4  Ankle plantarflexion    Ankle inversion    Ankle eversion      (Blank rows = not tested)  CERVICAL SPECIAL TESTS:  TBD  LUMBAR SPECIAL TESTS:  Straight leg raise test: Negative and Slump test: Negative  FUNCTIONAL TESTS:    GAIT: Distance walked: into clinic from parking lot Assistive device utilized: None Level of assistance: Complete Independence Gait pattern: WFL Comments: gait is WNL   TODAY'S TREATMENT:  03/19/24 Nustep L5x61min Supine B shld ER RTB 2x10 Supine B shld horiz ABD RTB 2x10 Supine LTR 5x10 both ways Bridge RTB + TrA 2x10 Supine march RTB + TrA 2x10 Seated green pball shoulder ext RTB x 20 Seated green pball shoulder row RTB x 10 Seated green pball march 2x10; LAQ 2x10 Seated 3 way flexion stretch with green pball x 1 min  03/16/24 THERAPEUTIC EXERCISE: To improve strength.  Demonstration, verbal and tactile cues throughout for technique. NuStep L5 x 5'  Standing row GTB x 20 BUE Doorway shoulder ER RTB x 2/10 BUE Doorway shoulder HABD RTB x 2/10 BUE Doorway pec stretch at 90 deg x 1' x 2 Supine  over pool noodle cervical rotation for MFR x 20 rotations R and L Seated hamstring stretch x 1' x 2 BLE Seated piriformis stretch x 1' x 2 BLE Supine bridge w/ TA x 2/10 BLE Supine clams RTB w/ TA x 2/10 BLE Standing back extension x 2/10   NEUROMUSCULAR RE-EDUCATION: To improve balance and posture. Green swiss ball bounce x 1' Green SB LAQ w/ TA x 10 BLE Green SB marching alternately w/ TA x 10 BLE  MANUAL THERAPY: To promote reduced pain utilizing therapeutic massage. Theragun to R low back along paraspinals from T12 to L5 in L SL  03/03/24  Seated hamstring stretch x 30' B Seated figure 4 stretch x 30' B Supine LTR both ways x 10 B Supine chin tucks 2x10 Supine figure 4 stretch x 30' B Supine chin tuck + rotation B x 10 Supine ab sets with PPT 10x5 Supine TrA + clamshell RTB 10x3   PATIENT EDUCATION:  Education details: HEP update Person educated: Patient Education method: Explanation, Demonstration, Verbal cues, Tactile cues, and Handouts Education comprehension: verbalized understanding, verbal cues required,  tactile cues required, and needs further education  HOME EXERCISE PROGRAM: Access Code: [email or web address] URL: [email or web address] Date: 03/19/2024 Prepared by: Sol Gaskins  Exercises - Seated Figure 4 Piriformis Stretch  - 1 x daily - 7 x weekly - 1 sets - 1 reps - Supine Figure 4 Piriformis Stretch  - 1 x daily - 7 x weekly - 1 sets - 1 reps - Seated Hamstring Stretch  - 1 x daily - 7 x weekly - 1 sets - 1 reps - 1 min hold - Supine Lower Trunk Rotation  - 1 x daily - 7 x weekly - 3 sets - 10 reps - Supine Cervical Retraction with Towel  - 1 x daily - 7 x weekly - 3 sets - 10 reps - Supine Scapular Retraction  - 1 x daily - 7 x weekly - 3 sets - 10 reps - Seated Scapular Retraction  - 1 x daily - 7 x weekly - 3 sets - 10 reps - Supine Suboccipital Release with Tennis Balls  - 1 x daily - 7 x weekly - 3 sets - 10 reps - Supine Cervical Rotation AROM  on Pillow  - 1 x daily - 7 x weekly - 2 sets - 10 reps - Hooklying Clamshell with Resistance  - 1 x daily - 7 x weekly - 2 sets - 10 reps - Supine Shoulder External Rotation with Resistance  - 1 x daily - 7 x weekly - 2 sets - 10 reps - Supine Shoulder Horizontal Abduction with Resistance  - 1 x daily - 7 x weekly - 2 sets - 10 reps   ASSESSMENT:  CLINICAL IMPRESSION:  Patient is able to progress with back and neck strengthening today.  Continued Tband scapular/postural corrective exercises as well as core stabilization. She is demonstrating improved lumbar AROM today but continues to have pain.  PT remains necessary for strength, ROM, pain deficits. Continue per POC. Will try to keep addressing pain levels as able.   OBJECTIVE IMPAIRMENTS: decreased ROM, decreased strength, impaired flexibility, postural dysfunction, and pain.   ACTIVITY LIMITATIONS: lifting, bending, and squatting  PARTICIPATION LIMITATIONS: cleaning, laundry, driving, and community activity  PERSONAL FACTORS: Age and 1-2 comorbidities:   DM, HTN, RUE paresthesiaare also affecting patient's functional outcome.   REHAB POTENTIAL: Good  CLINICAL DECISION MAKING: Evolving/moderate complexity  EVALUATION COMPLEXITY: Moderate   GOALS: Goals reviewed with patient? Yes  SHORT TERM GOALS: Target date: 03/19/24  Patient will be independent with initial HEP to improve outcomes and carryover.  Baseline: 100% PT assist required for correct completion Goal status: PARTIALLY MET- 03/19/24   2.  Patient will report 25% improvement in neck and  back pain to improve QOL. Baseline: 8/10 worst 03/16/24:  5/10 Goal status: MET  LONG TERM GOALS: Target date: 04/18/24  Patient will be independent with ongoing/advanced HEP for self-management at home.  Baseline: no advanced HEP Yet 9/22:  adding to HEP Goal status: IN PROGRESS  2.  Patient will report 50-75% improvement in neck and  back pain to improve QOL.  Baseline: 8/10  worst Goal status: IN PROGRESS  3.  Patient will demonstrate improved posture to decrease muscle imbalance. Baseline: flattened cervical lordosis Goal status: INITIAL   5.  Patient will demonstrate full pain free cervical ROM for safety with driving.  Baseline: see ROM tables above Goal status: INITIAL  6.  Patient will demonstrate full pain free lumbar ROM to perform ADLs.   Baseline: see ROM tables above Goal status: IN  PROGRESS- 03/19/24 see above ROM table, improving  7.  Patient will demonstrate improved functional strength as demonstrated by 5/5 strength in BLE and BUE to complete her ADLs and normal activities. Baseline: see MMT tables above Goal status: INITIAL  8.  Patient will report </= 20% on NDI (MCID = 22%) to demonstrate improved functional ability.  Baseline: 46% Goal status: INITIAL   9.  Patient will report </= 20% on Modified Oswestry (MCID = 12%) to demonstrate improved functional ability.  Baseline: 40% Goal status: INITIAL  10.  Patient to report ability to perform ADLs, household, and work-related tasks without limitation due to   pain, LOM or weakness Baseline: able to complete ADL, but in pain Goal status: INITIAL   PLAN:  PT FREQUENCY: 1-2x/week  PT DURATION: 8 weeks  PLANNED INTERVENTIONS: 97164- PT Re-evaluation, 97750- Physical Performance Testing, 97110-Therapeutic exercises, 97530- Therapeutic activity, [id number]- Neuromuscular re-education, 97535- Self Care, [id number]- Manual therapy, [id number]- Electrical stimulation (unattended), 97016- Vasopneumatic device, [id number]- Ultrasound, [id number]- Traction (mechanical), [id number]- Ionotophoresis 4mg /ml Dexamethasone , [id number] (1-2 muscles), 20561 (3+ muscles)- Dry Needling, Patient/Family education, Balance training, Stair training, Taping, Joint mobilization, Spinal mobilization, Cryotherapy, and Moist heat  PLAN FOR NEXT SESSION:  Continue with strengthening progression of back and scapula/cervical retractors and postural  muscles. Cervical and lumbar stretching.  Aaryanna Hyden L Kourtney Montesinos, PTA 03/19/2024, 5:03 PM

## 2024-03-23 ENCOUNTER — Ambulatory Visit

## 2024-03-23 DIAGNOSIS — M5459 Other low back pain: Secondary | ICD-10-CM

## 2024-03-23 DIAGNOSIS — M542 Cervicalgia: Secondary | ICD-10-CM | POA: Diagnosis not present

## 2024-03-23 DIAGNOSIS — M6281 Muscle weakness (generalized): Secondary | ICD-10-CM

## 2024-03-23 DIAGNOSIS — R293 Abnormal posture: Secondary | ICD-10-CM | POA: Diagnosis not present

## 2024-03-23 NOTE — Therapy (Signed)
 OUTPATIENT PHYSICAL THERAPY CERVICAL AND THORACOLUMBAR TREATMENT   Patient Name: Patricia Rodgers MRN: [id number] DOB:March 31, 1956, 68 y.o., female Today's Date: 03/23/2024   END OF SESSION:  PT End of Session - 03/23/24 1712     Visit Number 5    Date for Recertification  04/16/24    Authorization Type human[employer] MCR    PT Start Time 1618    PT Stop Time 1701    PT Time Calculation (min) 43 min    Activity Tolerance Patient tolerated treatment well    Behavior During Therapy WFL for tasks assessed/performed             Past Medical History:  Diagnosis Date   Breast mass    Diabetes mellitus without complication (HCC)    Pre diabetic   Hyperlipemia    Hypertension    Hypothyroidism    Internal hemorrhoids    Thyroid  disease    Past Surgical History:  Procedure Laterality Date   ABDOMINAL HYSTERECTOMY     APPENDECTOMY     BREAST EXCISIONAL BIOPSY Right 2019   DILATION AND CURETTAGE OF UTERUS     LEFT HEART CATHETERIZATION WITH CORONARY ANGIOGRAM N/A 04/26/2014   Procedure: LEFT HEART CATHETERIZATION WITH CORONARY ANGIOGRAM;  Surgeon: Erick JONELLE Bergamo, MD;  Location: [facility] CATH LAB;  Service: Cardiovascular;  Laterality: N/A;   RADIOACTIVE SEED GUIDED EXCISIONAL BREAST BIOPSY Right 05/13/2017   Procedure: RIGHT RADIOACTIVE SEED GUIDED EXCISIONAL BREAST BIOPSY ERAS PATHWAY;  Surgeon: Ethyl Lenis, MD;  Location:  SURGERY CENTER;  Service: General;  Laterality: Right;   Patient Active Problem List   Diagnosis Date Noted   Diabetes mellitus without complication (HCC) 06/12/2021   Nuclear sclerotic cataract of both eyes 06/12/2021   Paresthesia of right upper extremity 07/03/2016   Angina pectoris 04/25/2014    PCP: Catalina Bare, MD   REFERRING PROVIDER: Catalina Bare, MD  REFERRING DIAG: M54.50 (ICD-10-CM) - Low back pain, unspecified M54.2 (ICD-10-CM) - Cervicalgia  THERAPY DIAG:  Cervicalgia  Other low back pain  Muscle weakness  (generalized)  Abnormal posture  RATIONALE FOR EVALUATION AND TREATMENT: Rehabilitation  ONSET DATE: 7/25  NEXT MD VISIT:    SUBJECTIVE:                                                                                                                                                                                                         SUBJECTIVE STATEMENT: Still having pain today but feel like it's because of sitting all day at work  PAIN: Are you having pain? Yes: NPRS scale: 5/10  neck pain constantly;  7/10 LBP constant Pain location: neck and low back Pain description: throbbing pain in neck and back Aggravating factors: looking down/reading increased neck pain with headaches;  looking to the L is painful;   Bending and lifting increase back pain Relieving factors: lying down on heat helps low back  PERTINENT HISTORY:  DM, HTN, RUE paresthesia  PRECAUTIONS: None  HAND DOMINANCE: Right  RED FLAGS: None  WEIGHT BEARING RESTRICTIONS: No  FALLS:  Has patient fallen in last 6 months? 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 Just started the job recently.   PLOF: Independent with gait  PATIENT GOALS: not hurt in neck and back    OBJECTIVE:   DIAGNOSTIC FINDINGS: (from [facility] ED visit) CT CERVICAL SPINE FINDINGS  Alignment: Mild reversal of cervical lordosis. No subluxation. Facet alignment is normal  Skull base and vertebrae: No acute fracture. No primary bone lesion or focal pathologic process.  Soft tissues and spinal canal: No prevertebral fluid or swelling. No visible canal hematoma.  Disc levels: Mild disc space narrowing C4-C5, C5-C6 and C6-C7. Foraminal narrowing bilaterally at C5-C6  Upper chest: Negative.  Other: None    PATIENT  SURVEYS:  ODI = 40 NDI = 46  SCREENING FOR RED FLAGS: Bowel or bladder incontinence: No Spinal tumors: No Cauda equina syndrome: No Compression fracture: No Abdominal aneurysm: No  COGNITION: Overall cognitive status: Within functional limits for tasks assessed     SENSATION: WFL  POSTURE:  rounded shoulders and forward head  PALPATION: TTP over bilateral low back from pelvis to mid thoracic spine in general;  TTP over bilateral C-spine and uppertraps down to thoracic spine diffuse pain  CERVICAL ROM:   Active ROM Eval  Flexion 100; p!  Extension 50%; p!  Right lateral flexion 50%; R sided p!  Left lateral flexion 40% R sided pain  Right rotation 60%; R sided pain  Left rotation 40%; L sided pain    (Blank rows = not tested)  UPPER EXTREMITY ROM:  Active ROM Right eval Left eval  Shoulder flexion 160 160  Shoulder extension    Shoulder abduction 160 160  Shoulder adduction    Shoulder internal rotation L4 L3  Shoulder external rotation Base of neck B same  Elbow flexion    Elbow extension    Wrist flexion    Wrist extension    Wrist ulnar deviation    Wrist radial deviation    Wrist pronation    Wrist supination      (Blank rows = not tested)  UPPER EXTREMITY MMT:  MMT Right eval Left eval  Shoulder flexion 5 5  Shoulder extension    Shoulder abduction 4 4+  Shoulder adduction    Shoulder internal rotation 5 5  Shoulder external rotation 5 5  Middle trapezius    Lower trapezius    Elbow flexion    Elbow extension    Wrist flexion    Wrist extension    Wrist ulnar deviation    Wrist radial deviation    Wrist pronation    Wrist supination    Grip strength = B   (Blank rows = not tested)  LUMBAR ROM:   Active  Eval 03/19/24  Flexion To knees; p! To ankles  Extension 75%; p! 60%  Right lateral flexion To mid  thigh; p! To lateral knee  Left lateral flexion To mid thigh To lateral knee  Right rotation 75%; some p! 50%  Left rotation 75%;  some p! 50%    (Blank rows = not tested)  MUSCLE LENGTH: Hamstrings: Right SLR 75 deg; Left SLR 80 deg Thomas test: Right WNL deg; Left WNL deg Hamstrings: mild tightness ITB: WNL Piriformis: mild tightness BLE Hip flexors: NT Quads: NT Heelcord: NT  LOWER EXTREMITY ROM:     Active  Right eval Left eval  Hip flexion 120+ 120+  Hip extension    Hip abduction    Hip adduction    Hip internal rotation 40 40  Hip external rotation 50-60 50-60  Knee flexion    Knee extension    Ankle dorsiflexion    Ankle plantarflexion    Ankle inversion    Ankle eversion     (Blank rows = not tested)  LOWER EXTREMITY MMT:    MMT Right eval Left eval  Hip flexion 4 4  Hip extension 4- 4-  Hip abduction 4- 4-  Hip adduction    Hip internal rotation 4- 4-  Hip external rotation 4+ 4+  Knee flexion 5 5  Knee extension 5 5  Ankle dorsiflexion 4 4  Ankle plantarflexion    Ankle inversion    Ankle eversion      (Blank rows = not tested)  CERVICAL SPECIAL TESTS:  TBD  LUMBAR SPECIAL TESTS:  Straight leg raise test: Negative and Slump test: Negative  FUNCTIONAL TESTS:    GAIT: Distance walked: into clinic from parking lot Assistive device utilized: None Level of assistance: Complete Independence Gait pattern: WFL Comments: gait is WNL   TODAY'S TREATMENT:  03/23/24 Nustep L5x61min  STM to B UT, levator, scalenes with pin & stretch to scalenes, suboccipital release; Seated scalene stretch 2x30 B Lat pulldown 20lb x 10  Seated rows 15lb high grip x 10 Standing open books x 5 each way   03/19/24 Nustep L5x24min Supine B shld ER RTB 2x10 Supine B shld horiz ABD RTB 2x10 Supine LTR 5x10 both ways Bridge RTB + TrA 2x10 Supine march RTB + TrA 2x10 Seated green pball shoulder ext RTB x 20 Seated green pball shoulder row RTB x 10 Seated green pball march 2x10; LAQ 2x10 Seated 3 way flexion stretch with green pball x 1 min  03/16/24 THERAPEUTIC EXERCISE: To improve  strength.  Demonstration, verbal and tactile cues throughout for technique. NuStep L5 x 5'  Standing row GTB x 20 BUE Doorway shoulder ER RTB x 2/10 BUE Doorway shoulder HABD RTB x 2/10 BUE Doorway pec stretch at 90 deg x 1' x 2 Supine over pool noodle cervical rotation for MFR x 20 rotations R and L Seated hamstring stretch x 1' x 2 BLE Seated piriformis stretch x 1' x 2 BLE Supine bridge w/ TA x 2/10 BLE Supine clams RTB w/ TA x 2/10 BLE Standing back extension x 2/10   NEUROMUSCULAR RE-EDUCATION: To improve balance and posture. Green swiss ball bounce x 1' Green SB LAQ w/ TA x 10 BLE Green SB marching alternately w/ TA x 10 BLE  MANUAL THERAPY: To promote reduced pain utilizing therapeutic massage. Theragun to R low back along paraspinals from T12 to L5 in L SL  03/03/24  Seated hamstring stretch x 30' B Seated figure 4 stretch x 30' B Supine LTR both ways x 10 B Supine chin tucks 2x10 Supine figure 4 stretch x 30' B Supine chin tuck +  rotation B x 10 Supine ab sets with PPT 10x5 Supine TrA + clamshell RTB 10x3   PATIENT EDUCATION:  Education details: HEP update Person educated: Patient Education method: Explanation, Demonstration, Verbal cues, Tactile cues, and Handouts Education comprehension: verbalized understanding, verbal cues required, tactile cues required, and needs further education  HOME EXERCISE PROGRAM: Access Code: [email or web address] URL: [email or web address] Date: 03/19/2024 Prepared by: Sol Gaskins  Exercises - Seated Figure 4 Piriformis Stretch  - 1 x daily - 7 x weekly - 1 sets - 1 reps - Supine Figure 4 Piriformis Stretch  - 1 x daily - 7 x weekly - 1 sets - 1 reps - Seated Hamstring Stretch  - 1 x daily - 7 x weekly - 1 sets - 1 reps - 1 min hold - Supine Lower Trunk Rotation  - 1 x daily - 7 x weekly - 3 sets - 10 reps - Supine Cervical Retraction with Towel  - 1 x daily - 7 x weekly - 3 sets - 10 reps - Supine Scapular Retraction  -  1 x daily - 7 x weekly - 3 sets - 10 reps - Seated Scapular Retraction  - 1 x daily - 7 x weekly - 3 sets - 10 reps - Supine Suboccipital Release with Tennis Balls  - 1 x daily - 7 x weekly - 3 sets - 10 reps - Supine Cervical Rotation AROM on Pillow  - 1 x daily - 7 x weekly - 2 sets - 10 reps - Hooklying Clamshell with Resistance  - 1 x daily - 7 x weekly - 2 sets - 10 reps - Supine Shoulder External Rotation with Resistance  - 1 x daily - 7 x weekly - 2 sets - 10 reps - Supine Shoulder Horizontal Abduction with Resistance  - 1 x daily - 7 x weekly - 2 sets - 10 reps   ASSESSMENT:  CLINICAL IMPRESSION:  Patient notes her pain unchanged. She presented with increased muscle tension R side of neck > L side. She had some trigger points and noted improvement in tension after manual. Followed with postural facilitation using weight machines. Estim + heat may be something to try in the future for pain and muscle tone.   OBJECTIVE IMPAIRMENTS: decreased ROM, decreased strength, impaired flexibility, postural dysfunction, and pain.   ACTIVITY LIMITATIONS: lifting, bending, and squatting  PARTICIPATION LIMITATIONS: cleaning, laundry, driving, and community activity  PERSONAL FACTORS: Age and 1-2 comorbidities:   DM, HTN, RUE paresthesiaare also affecting patient's functional outcome.   REHAB POTENTIAL: Good  CLINICAL DECISION MAKING: Evolving/moderate complexity  EVALUATION COMPLEXITY: Moderate   GOALS: Goals reviewed with patient? Yes  SHORT TERM GOALS: Target date: 03/19/24  Patient will be independent with initial HEP to improve outcomes and carryover.  Baseline: 100% PT assist required for correct completion Goal status: PARTIALLY MET- 03/19/24   2.  Patient will report 25% improvement in neck and  back pain to improve QOL. Baseline: 8/10 worst 03/16/24:  5/10 Goal status: MET  LONG TERM GOALS: Target date: 04/18/24  Patient will be independent with ongoing/advanced HEP for  self-management at home.  Baseline: no advanced HEP Yet 9/22:  adding to HEP Goal status: IN PROGRESS  2.  Patient will report 50-75% improvement in neck and  back pain to improve QOL.  Baseline: 8/10 worst Goal status: IN PROGRESS  3.  Patient will demonstrate improved posture to decrease muscle imbalance. Baseline: flattened cervical lordosis Goal status: INITIAL   5.  Patient will  demonstrate full pain free cervical ROM for safety with driving.  Baseline: see ROM tables above Goal status: INITIAL  6.  Patient will demonstrate full pain free lumbar ROM to perform ADLs.   Baseline: see ROM tables above Goal status: IN PROGRESS- 03/19/24 see above ROM table, improving  7.  Patient will demonstrate improved functional strength as demonstrated by 5/5 strength in BLE and BUE to complete her ADLs and normal activities. Baseline: see MMT tables above Goal status: INITIAL  8.  Patient will report </= 20% on NDI (MCID = 22%) to demonstrate improved functional ability.  Baseline: 46% Goal status: INITIAL   9.  Patient will report </= 20% on Modified Oswestry (MCID = 12%) to demonstrate improved functional ability.  Baseline: 40% Goal status: INITIAL  10.  Patient to report ability to perform ADLs, household, and work-related tasks without limitation due to   pain, LOM or weakness Baseline: able to complete ADL, but in pain Goal status: INITIAL   PLAN:  PT FREQUENCY: 1-2x/week  PT DURATION: 8 weeks  PLANNED INTERVENTIONS: 97164- PT Re-evaluation, 97750- Physical Performance Testing, 97110-Therapeutic exercises, 97530- Therapeutic activity, [id number]- Neuromuscular re-education, 97535- Self Care, [id number]- Manual therapy, [id number]- Electrical stimulation (unattended), 97016- Vasopneumatic device, [id number]- Ultrasound, [id number]- Traction (mechanical), [id number]- Ionotophoresis 4mg /ml Dexamethasone , 79439 (1-2 muscles), 20561 (3+ muscles)- Dry Needling, Patient/Family education, Balance training, Stair  training, Taping, Joint mobilization, Spinal mobilization, Cryotherapy, and Moist heat  PLAN FOR NEXT SESSION:  Continue with strengthening progression of back and scapula/cervical retractors and postural muscles. Cervical and lumbar stretching.  Staphany Ditton L Ryelee Albee, PTA 03/23/2024, 5:13 PM

## 2024-03-26 ENCOUNTER — Ambulatory Visit: Attending: Internal Medicine | Admitting: Rehabilitation

## 2024-03-26 DIAGNOSIS — M5459 Other low back pain: Secondary | ICD-10-CM | POA: Insufficient documentation

## 2024-03-26 DIAGNOSIS — M542 Cervicalgia: Secondary | ICD-10-CM | POA: Diagnosis not present

## 2024-03-26 DIAGNOSIS — M6281 Muscle weakness (generalized): Secondary | ICD-10-CM | POA: Insufficient documentation

## 2024-03-26 DIAGNOSIS — R293 Abnormal posture: Secondary | ICD-10-CM | POA: Insufficient documentation

## 2024-03-26 NOTE — Therapy (Signed)
 OUTPATIENT PHYSICAL THERAPY CERVICAL AND THORACOLUMBAR TREATMENT   Patient Name: Patricia Rodgers MRN: [id number] DOB:1956/03/22, 68 y.o., female Today's Date: 03/26/2024   END OF SESSION:  PT End of Session - 03/26/24 1717     Visit Number 6    Date for Recertification  04/16/24    Authorization Type human[employer] MCR    PT Start Time 1620    PT Stop Time 1700    PT Time Calculation (min) 40 min    Activity Tolerance Patient tolerated treatment well    Behavior During Therapy WFL for tasks assessed/performed              Past Medical History:  Diagnosis Date   Breast mass    Diabetes mellitus without complication (HCC)    Pre diabetic   Hyperlipemia    Hypertension    Hypothyroidism    Internal hemorrhoids    Thyroid  disease    Past Surgical History:  Procedure Laterality Date   ABDOMINAL HYSTERECTOMY     APPENDECTOMY     BREAST EXCISIONAL BIOPSY Right 2019   DILATION AND CURETTAGE OF UTERUS     LEFT HEART CATHETERIZATION WITH CORONARY ANGIOGRAM N/A 04/26/2014   Procedure: LEFT HEART CATHETERIZATION WITH CORONARY ANGIOGRAM;  Surgeon: Erick JONELLE Bergamo, MD;  Location: [facility] CATH LAB;  Service: Cardiovascular;  Laterality: N/A;   RADIOACTIVE SEED GUIDED EXCISIONAL BREAST BIOPSY Right 05/13/2017   Procedure: RIGHT RADIOACTIVE SEED GUIDED EXCISIONAL BREAST BIOPSY ERAS PATHWAY;  Surgeon: Ethyl Lenis, MD;  Location:  SURGERY CENTER;  Service: General;  Laterality: Right;   Patient Active Problem List   Diagnosis Date Noted   Diabetes mellitus without complication (HCC) 06/12/2021   Nuclear sclerotic cataract of both eyes 06/12/2021   Paresthesia of right upper extremity 07/03/2016   Angina pectoris 04/25/2014    PCP: Catalina Bare, MD   REFERRING PROVIDER: Catalina Bare, MD  REFERRING DIAG: M54.50 (ICD-10-CM) - Low back pain, unspecified M54.2 (ICD-10-CM) - Cervicalgia  THERAPY DIAG:  Cervicalgia  Other low back pain  Muscle weakness  (generalized)  Abnormal posture  RATIONALE FOR EVALUATION AND TREATMENT: Rehabilitation  ONSET DATE: 7/25  NEXT MD VISIT:    SUBJECTIVE:                                                                                                                                                                                                         SUBJECTIVE STATEMENT: States neck pain feels better and low back pain is 4/10.    PAIN: Are you having pain? Yes: NPRS scale: 5/10 neck  pain constantly;  7/10 LBP constant Pain location: neck and low back Pain description: throbbing pain in neck and back Aggravating factors: looking down/reading increased neck pain with headaches;  looking to the L is painful;   Bending and lifting increase back pain Relieving factors: lying down on heat helps low back  PERTINENT HISTORY:  DM, HTN, RUE paresthesia  PRECAUTIONS: None  HAND DOMINANCE: Right  RED FLAGS: None  WEIGHT BEARING RESTRICTIONS: No  FALLS:  Has patient fallen in last 6 months? 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 Just started the job recently.   PLOF: Independent with gait  PATIENT GOALS: not hurt in neck and back    OBJECTIVE:   DIAGNOSTIC FINDINGS: (from [facility] ED visit) CT CERVICAL SPINE FINDINGS  Alignment: Mild reversal of cervical lordosis. No subluxation. Facet alignment is normal  Skull base and vertebrae: No acute fracture. No primary bone lesion or focal pathologic process.  Soft tissues and spinal canal: No prevertebral fluid or swelling. No visible canal hematoma.  Disc levels: Mild disc space narrowing C4-C5, C5-C6 and C6-C7. Foraminal narrowing bilaterally at C5-C6  Upper chest: Negative.  Other: None    PATIENT SURVEYS:  ODI =  40 NDI = 46  SCREENING FOR RED FLAGS: Bowel or bladder incontinence: No Spinal tumors: No Cauda equina syndrome: No Compression fracture: No Abdominal aneurysm: No  COGNITION: Overall cognitive status: Within functional limits for tasks assessed     SENSATION: WFL  POSTURE:  rounded shoulders and forward head  PALPATION: TTP over bilateral low back from pelvis to mid thoracic spine in general;  TTP over bilateral C-spine and uppertraps down to thoracic spine diffuse pain  CERVICAL ROM:   Active ROM Eval 03/26/24  Flexion 100; p! 100%  Extension 50%; p! 60%; less p!  Right lateral flexion 50%; R sided p! 60; slight p!  Left lateral flexion 40% R sided pain 40%slight p!  Right rotation 60%; R sided pain 70%  Left rotation 40%; L sided pain 50%    (Blank rows = not tested)  UPPER EXTREMITY ROM:  Active ROM Right eval Left eval  Shoulder flexion 160 160  Shoulder extension    Shoulder abduction 160 160  Shoulder adduction    Shoulder internal rotation L4 L3  Shoulder external rotation Base of neck B same  Elbow flexion    Elbow extension    Wrist flexion    Wrist extension    Wrist ulnar deviation    Wrist radial deviation    Wrist pronation    Wrist supination      (Blank rows = not tested)  UPPER EXTREMITY MMT:  MMT Right eval Left eval  Shoulder flexion 5 5  Shoulder extension    Shoulder abduction 4 4+  Shoulder adduction    Shoulder internal rotation 5 5  Shoulder external rotation 5 5  Middle trapezius    Lower trapezius    Elbow flexion    Elbow extension    Wrist flexion    Wrist extension    Wrist ulnar deviation    Wrist radial deviation    Wrist pronation    Wrist supination    Grip strength = B   (Blank rows = not tested)  LUMBAR ROM:   Active  Eval 03/19/24  Flexion To knees; p! To ankles  Extension 75%; p! 60%  Right lateral flexion To mid thigh; p! To lateral knee  Left lateral flexion To mid thigh To lateral knee  Right  rotation 75%; some p! 50%  Left rotation 75%; some p! 50%    (Blank rows = not tested)  MUSCLE LENGTH: Hamstrings: Right SLR 75 deg; Left SLR 80 deg Thomas test: Right WNL deg; Left WNL deg Hamstrings: mild tightness ITB: WNL Piriformis: mild tightness BLE Hip flexors: NT Quads: NT Heelcord: NT  LOWER EXTREMITY ROM:     Active  Right eval Left eval  Hip flexion 120+ 120+  Hip extension    Hip abduction    Hip adduction    Hip internal rotation 40 40  Hip external rotation 50-60 50-60  Knee flexion    Knee extension    Ankle dorsiflexion    Ankle plantarflexion    Ankle inversion    Ankle eversion     (Blank rows = not tested)  LOWER EXTREMITY MMT:    MMT Right eval Left eval  Hip flexion 4 4  Hip extension 4- 4-  Hip abduction 4- 4-  Hip adduction    Hip internal rotation 4- 4-  Hip external rotation 4+ 4+  Knee flexion 5 5  Knee extension 5 5  Ankle dorsiflexion 4 4  Ankle plantarflexion    Ankle inversion    Ankle eversion      (Blank rows = not tested)  CERVICAL SPECIAL TESTS:  TBD  LUMBAR SPECIAL TESTS:  Straight leg raise test: Negative and Slump test: Negative  FUNCTIONAL TESTS:    GAIT: Distance walked: into clinic from parking lot Assistive device utilized: None Level of assistance: Complete Independence Gait pattern: WFL Comments: gait is WNL   TODAY'S TREATMENT:  03/26/24 NuStep L5 x 6'  Supine bridge RTB x 2/10 BLE Supine manual ham stretch x 1' x 2 RLE Supine cross over piriformis stretch x 1' x 2 RLE Supine FABER x 1' x 2 RLE Bridge w/ RTB x 2/10 BLE Supine peanut ball rollouts w/ TA x 10;  w/ PPT x 10 BLE Bridge on ball w/ TA x 2/10 BLE Standing plank at joymor rail w/ hip ext and cerv retraction x 10 BLE Standing row blue TB x 2/10 BUE MFI walkouts RTB x 5 BUE Standing back ext x 10   MANUAL THERAPY: To promote reduced pain utilizing therapeutic massage. Theragun to R low back along paraspinals from T12 to L5 in L  SL   03/23/24 Nustep L5x23min  STM to B UT, levator, scalenes with pin & stretch to scalenes, suboccipital release; Seated scalene stretch 2x30 B Lat pulldown 20lb x 10  Seated rows 15lb high grip x 10 Standing open books x 5 each way   03/19/24 Nustep L5x31min Supine B shld ER RTB 2x10 Supine B shld horiz ABD RTB 2x10 Supine LTR 5x10 both ways Bridge RTB + TrA 2x10 Supine march RTB + TrA 2x10 Seated green pball shoulder ext RTB x 20 Seated green pball shoulder row RTB x 10 Seated green pball march 2x10; LAQ 2x10 Seated 3 way flexion stretch with green pball x 1 min  03/16/24 THERAPEUTIC EXERCISE: To improve strength.  Demonstration, verbal and tactile cues throughout for technique. NuStep L5 x 5'  Standing row GTB x 20 BUE Doorway shoulder ER RTB x 2/10 BUE Doorway shoulder HABD RTB x 2/10 BUE Doorway pec stretch at 90 deg x 1' x 2 Supine over pool noodle cervical rotation for MFR  x 20 rotations R and L Seated hamstring stretch x 1' x 2 BLE Seated piriformis stretch x 1' x 2 BLE Supine bridge w/ TA x 2/10 BLE Supine clams RTB w/ TA x 2/10 BLE Standing back extension x 2/10   NEUROMUSCULAR RE-EDUCATION: To improve balance and posture. Green swiss ball bounce x 1' Green SB LAQ w/ TA x 10 BLE Green SB marching alternately w/ TA x 10 BLE  MANUAL THERAPY: To promote reduced pain utilizing therapeutic massage. Theragun to R low back along paraspinals from T12 to L5 in L SL  03/03/24  Seated hamstring stretch x 30' B Seated figure 4 stretch x 30' B Supine LTR both ways x 10 B Supine chin tucks 2x10 Supine figure 4 stretch x 30' B Supine chin tuck + rotation B x 10 Supine ab sets with PPT 10x5 Supine TrA + clamshell RTB 10x3   PATIENT EDUCATION:  Education details: HEP update Person educated: Patient Education method: Explanation, Demonstration, Verbal cues, Tactile cues, and Handouts Education comprehension: verbalized understanding, verbal cues required, tactile  cues required, and needs further education  HOME EXERCISE PROGRAM: Access Code: [email or web address] URL: [email or web address] Date: 03/19/2024 Prepared by: Sol Gaskins  Exercises - Seated Figure 4 Piriformis Stretch  - 1 x daily - 7 x weekly - 1 sets - 1 reps - Supine Figure 4 Piriformis Stretch  - 1 x daily - 7 x weekly - 1 sets - 1 reps - Seated Hamstring Stretch  - 1 x daily - 7 x weekly - 1 sets - 1 reps - 1 min hold - Supine Lower Trunk Rotation  - 1 x daily - 7 x weekly - 3 sets - 10 reps - Supine Cervical Retraction with Towel  - 1 x daily - 7 x weekly - 3 sets - 10 reps - Supine Scapular Retraction  - 1 x daily - 7 x weekly - 3 sets - 10 reps - Seated Scapular Retraction  - 1 x daily - 7 x weekly - 3 sets - 10 reps - Supine Suboccipital Release with Tennis Balls  - 1 x daily - 7 x weekly - 3 sets - 10 reps - Supine Cervical Rotation AROM on Pillow  - 1 x daily - 7 x weekly - 2 sets - 10 reps - Hooklying Clamshell with Resistance  - 1 x daily - 7 x weekly - 2 sets - 10 reps - Supine Shoulder External Rotation with Resistance  - 1 x daily - 7 x weekly - 2 sets - 10 reps - Supine Shoulder Horizontal Abduction with Resistance  - 1 x daily - 7 x weekly - 2 sets - 10 reps   ASSESSMENT:  CLINICAL IMPRESSION:  Patient much improved with neck pain today.  Rechecked cervical ROM and there are still deficits going to the L w/ SB and rotation.  She is still having the R sided LBP and HEP is advanced today.   She demonstrates some difficulty maintaining good TA contraction with exercises.   PT remains necessary for pain, ROM, strength, HEP deficit.s  Continue per POC   OBJECTIVE IMPAIRMENTS: decreased ROM, decreased strength, impaired flexibility, postural dysfunction, and pain.   ACTIVITY LIMITATIONS: lifting, bending, and squatting  PARTICIPATION LIMITATIONS: cleaning, laundry, driving, and community activity  PERSONAL FACTORS: Age and 1-2 comorbidities:   DM, HTN, RUE  paresthesiaare also affecting patient's functional outcome.   REHAB POTENTIAL: Good  CLINICAL DECISION MAKING: Evolving/moderate complexity  EVALUATION COMPLEXITY: Moderate   GOALS: Goals reviewed  with patient? Yes  SHORT TERM GOALS: Target date: 03/19/24  Patient will be independent with initial HEP to improve outcomes and carryover.  Baseline: 100% PT assist required for correct completion Goal status: PARTIALLY MET- 03/19/24   2.  Patient will report 25% improvement in neck and  back pain to improve QOL. Baseline: 8/10 worst 03/16/24:  5/10 Goal status: MET  LONG TERM GOALS: Target date: 04/18/24  Patient will be independent with ongoing/advanced HEP for self-management at home.  Baseline: no advanced HEP Yet 9/22:  adding to HEP Goal status: IN PROGRESS  2.  Patient will report 50-75% improvement in neck and  back pain to improve QOL.  Baseline: 8/10 worst Goal status: IN PROGRESS  3.  Patient will demonstrate improved posture to decrease muscle imbalance. Baseline: flattened cervical lordosis Goal status: INITIAL   5.  Patient will demonstrate full pain free cervical ROM for safety with driving.  Baseline: see ROM tables above 03/25/24:  see tables, updated Goal status: IN PROGRESS  6.  Patient will demonstrate full pain free lumbar ROM to perform ADLs.   Baseline: see ROM tables above Goal status: IN PROGRESS- 03/19/24 see above ROM table, improving  7.  Patient will demonstrate improved functional strength as demonstrated by 5/5 strength in BLE and BUE to complete her ADLs and normal activities. Baseline: see MMT tables above Goal status: INITIAL  8.  Patient will report </= 20% on NDI (MCID = 22%) to demonstrate improved functional ability.  Baseline: 46% Goal status: INITIAL   9.  Patient will report </= 20% on Modified Oswestry (MCID = 12%) to demonstrate improved functional ability.  Baseline: 40% Goal status: INITIAL  10.  Patient to report ability  to perform ADLs, household, and work-related tasks without limitation due to   pain, LOM or weakness Baseline: able to complete ADL, but in pain Goal status: INITIAL   PLAN:  PT FREQUENCY: 1-2x/week  PT DURATION: 8 weeks  PLANNED INTERVENTIONS: 97164- PT Re-evaluation, 97750- Physical Performance Testing, 97110-Therapeutic exercises, 97530- Therapeutic activity, [id number]- Neuromuscular re-education, 97535- Self Care, [id number]- Manual therapy, [id number]- Electrical stimulation (unattended), 97016- Vasopneumatic device, [id number]- Ultrasound, [id number]- Traction (mechanical), [id number]- Ionotophoresis 4mg /ml Dexamethasone , 79439 (1-2 muscles), 20561 (3+ muscles)- Dry Needling, Patient/Family education, Balance training, Stair training, Taping, Joint mobilization, Spinal mobilization, Cryotherapy, and Moist heat  PLAN FOR NEXT SESSION:  Try some towel neck stretching for L rotation and extension.  Continue w/ lumbar and cervical stretching/strengthening;  Needs outcome measures rechecked soon  Linden Tagliaferro, PT 03/26/2024, 5:19 PM

## 2024-03-30 ENCOUNTER — Ambulatory Visit

## 2024-03-30 DIAGNOSIS — M6281 Muscle weakness (generalized): Secondary | ICD-10-CM

## 2024-03-30 DIAGNOSIS — M5459 Other low back pain: Secondary | ICD-10-CM

## 2024-03-30 DIAGNOSIS — R293 Abnormal posture: Secondary | ICD-10-CM | POA: Diagnosis not present

## 2024-03-30 DIAGNOSIS — M542 Cervicalgia: Secondary | ICD-10-CM | POA: Diagnosis not present

## 2024-03-30 NOTE — Therapy (Signed)
 OUTPATIENT PHYSICAL THERAPY CERVICAL AND THORACOLUMBAR TREATMENT   Patient Name: Patricia Rodgers MRN: [id number] DOB:1956/05/16, 68 y.o., female Today's Date: 03/30/2024   END OF SESSION:  PT End of Session - 03/30/24 1704     Visit Number 7    Date for Recertification  04/16/24    Authorization Type humana MCR    PT Start Time 1619    PT Stop Time 1707    PT Time Calculation (min) 48 min    Activity Tolerance Patient tolerated treatment well    Behavior During Therapy WFL for tasks assessed/performed               Past Medical History:  Diagnosis Date   Breast mass    Diabetes mellitus without complication (HCC)    Pre diabetic   Hyperlipemia    Hypertension    Hypothyroidism    Internal hemorrhoids    Thyroid  disease    Past Surgical History:  Procedure Laterality Date   ABDOMINAL HYSTERECTOMY     APPENDECTOMY     BREAST EXCISIONAL BIOPSY Right 2019   DILATION AND CURETTAGE OF UTERUS     LEFT HEART CATHETERIZATION WITH CORONARY ANGIOGRAM N/A 04/26/2014   Procedure: LEFT HEART CATHETERIZATION WITH CORONARY ANGIOGRAM;  Surgeon: Erick JONELLE Bergamo, MD;  Location: [facility] CATH LAB;  Service: Cardiovascular;  Laterality: N/A;   RADIOACTIVE SEED GUIDED EXCISIONAL BREAST BIOPSY Right 05/13/2017   Procedure: RIGHT RADIOACTIVE SEED GUIDED EXCISIONAL BREAST BIOPSY ERAS PATHWAY;  Surgeon: Ethyl Lenis, MD;  Location: [facility];  Service: General;  Laterality: Right;   Patient Active Problem List   Diagnosis Date Noted   Diabetes mellitus without complication (HCC) 06/12/2021   Nuclear sclerotic cataract of both eyes 06/12/2021   Paresthesia of right upper extremity 07/03/2016   Angina pectoris 04/25/2014    PCP: Catalina Bare, MD   REFERRING PROVIDER: Catalina Bare, MD  REFERRING DIAG: M54.50 (ICD-10-CM) - Low back pain, unspecified M54.2 (ICD-10-CM) - Cervicalgia  THERAPY DIAG:  Cervicalgia  Other low back pain  Muscle weakness  (generalized)  Abnormal posture  RATIONALE FOR EVALUATION AND TREATMENT: Rehabilitation  ONSET DATE: 7/25  NEXT MD VISIT:    SUBJECTIVE:                                                                                                                                                                                                         SUBJECTIVE STATEMENT: States neck pain feels better and low back pain is 4/10.    PAIN: Are you having pain? Yes: NPRS scale: 5/10  neck pain constantly;  7/10 LBP constant Pain location: neck and low back Pain description: throbbing pain in neck and back Aggravating factors: looking down/reading increased neck pain with headaches;  looking to the L is painful;   Bending and lifting increase back pain Relieving factors: lying down on heat helps low back  PERTINENT HISTORY:  DM, HTN, RUE paresthesia  PRECAUTIONS: None  HAND DOMINANCE: Right  RED FLAGS: None  WEIGHT BEARING RESTRICTIONS: No  FALLS:  Has patient fallen in last 6 months? 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 Just started the job recently.   PLOF: Independent with gait  PATIENT GOALS: not hurt in neck and back    OBJECTIVE:   DIAGNOSTIC FINDINGS: (from [facility] ED visit) CT CERVICAL SPINE FINDINGS  Alignment: Mild reversal of cervical lordosis. No subluxation. Facet alignment is normal  Skull base and vertebrae: No acute fracture. No primary bone lesion or focal pathologic process.  Soft tissues and spinal canal: No prevertebral fluid or swelling. No visible canal hematoma.  Disc levels: Mild disc space narrowing C4-C5, C5-C6 and C6-C7. Foraminal narrowing bilaterally at C5-C6  Upper chest: Negative.  Other: None    PATIENT SURVEYS:  ODI =  40 NDI = 46  SCREENING FOR RED FLAGS: Bowel or bladder incontinence: No Spinal tumors: No Cauda equina syndrome: No Compression fracture: No Abdominal aneurysm: No  COGNITION: Overall cognitive status: Within functional limits for tasks assessed     SENSATION: WFL  POSTURE:  rounded shoulders and forward head  PALPATION: TTP over bilateral low back from pelvis to mid thoracic spine in general;  TTP over bilateral C-spine and uppertraps down to thoracic spine diffuse pain  CERVICAL ROM:   Active ROM Eval 03/26/24  Flexion 100; p! 100%  Extension 50%; p! 60%; less p!  Right lateral flexion 50%; R sided p! 60; slight p!  Left lateral flexion 40% R sided pain 40%slight p!  Right rotation 60%; R sided pain 70%  Left rotation 40%; L sided pain 50%    (Blank rows = not tested)  UPPER EXTREMITY ROM:  Active ROM Right eval Left eval  Shoulder flexion 160 160  Shoulder extension    Shoulder abduction 160 160  Shoulder adduction    Shoulder internal rotation L4 L3  Shoulder external rotation Base of neck B same  Elbow flexion    Elbow extension    Wrist flexion    Wrist extension    Wrist ulnar deviation    Wrist radial deviation    Wrist pronation    Wrist supination      (Blank rows = not tested)  UPPER EXTREMITY MMT:  MMT Right eval Left eval  Shoulder flexion 5 5  Shoulder extension    Shoulder abduction 4 4+  Shoulder adduction    Shoulder internal rotation 5 5  Shoulder external rotation 5 5  Middle trapezius    Lower trapezius    Elbow flexion    Elbow extension    Wrist flexion    Wrist extension    Wrist ulnar deviation    Wrist radial deviation    Wrist pronation    Wrist supination    Grip strength = B   (Blank rows = not tested)  LUMBAR ROM:   Active  Eval 03/19/24  Flexion To knees; p! To  ankles  Extension 75%; p! 60%  Right lateral flexion To mid thigh; p! To lateral knee  Left lateral flexion To mid thigh To lateral knee  Right  rotation 75%; some p! 50%  Left rotation 75%; some p! 50%    (Blank rows = not tested)  MUSCLE LENGTH: Hamstrings: Right SLR 75 deg; Left SLR 80 deg Thomas test: Right WNL deg; Left WNL deg Hamstrings: mild tightness ITB: WNL Piriformis: mild tightness BLE Hip flexors: NT Quads: NT Heelcord: NT  LOWER EXTREMITY ROM:     Active  Right eval Left eval  Hip flexion 120+ 120+  Hip extension    Hip abduction    Hip adduction    Hip internal rotation 40 40  Hip external rotation 50-60 50-60  Knee flexion    Knee extension    Ankle dorsiflexion    Ankle plantarflexion    Ankle inversion    Ankle eversion     (Blank rows = not tested)  LOWER EXTREMITY MMT:    MMT Right eval Left eval  Hip flexion 4 4  Hip extension 4- 4-  Hip abduction 4- 4-  Hip adduction    Hip internal rotation 4- 4-  Hip external rotation 4+ 4+  Knee flexion 5 5  Knee extension 5 5  Ankle dorsiflexion 4 4  Ankle plantarflexion    Ankle inversion    Ankle eversion      (Blank rows = not tested)  CERVICAL SPECIAL TESTS:  TBD  LUMBAR SPECIAL TESTS:  Straight leg raise test: Negative and Slump test: Negative  FUNCTIONAL TESTS:    GAIT: Distance walked: into clinic from parking lot Assistive device utilized: None Level of assistance: Complete Independence Gait pattern: WFL Comments: gait is WNL   TODAY'S TREATMENT:  03/30/24 UBE L1.5 4 min fwd/2 min back STM to B lumbar paraspinals; foam roll to thoracolumbar PS all in prone Supine DKTC, bridge orange pball x 20 each Supine LTR orange pball x 10 Supine clams blue TB x 20 Seated rows 15lb x 20 Hot pack to low back in sitting x 5 min  03/26/24 NuStep L5 x 6'  Supine bridge RTB x 2/10 BLE Supine manual ham stretch x 1' x 2 RLE Supine cross over piriformis stretch x 1' x 2 RLE Supine FABER x 1' x 2 RLE Bridge w/ RTB x 2/10 BLE Supine peanut ball rollouts w/ TA x 10;  w/ PPT x 10 BLE Bridge on ball w/ TA x 2/10 BLE Standing plank at  joymor rail w/ hip ext and cerv retraction x 10 BLE Standing row blue TB x 2/10 BUE MFI walkouts RTB x 5 BUE Standing back ext x 10   MANUAL THERAPY: To promote reduced pain utilizing therapeutic massage. Theragun to R low back along paraspinals from T12 to L5 in L SL   03/23/24 Nustep L5x33min  STM to B UT, levator, scalenes with pin & stretch to scalenes, suboccipital release; Seated scalene stretch 2x30 B Lat pulldown 20lb x 10  Seated rows 15lb high grip x 10 Standing open books x 5 each way   03/19/24 Nustep L5x58min Supine B shld ER RTB 2x10 Supine B shld horiz ABD RTB 2x10 Supine LTR 5x10 both ways Bridge RTB + TrA 2x10 Supine march RTB + TrA 2x10 Seated green pball shoulder ext RTB x 20 Seated green pball shoulder row RTB x 10 Seated green pball march 2x10; LAQ 2x10 Seated 3 way flexion stretch with green pball x 1 min  03/16/24 THERAPEUTIC EXERCISE: To improve strength.  Demonstration, verbal and tactile cues throughout for technique. NuStep L5 x 5'  Standing row GTB x 20 BUE Doorway shoulder ER RTB x 2/10 BUE Doorway shoulder HABD RTB x 2/10 BUE Doorway pec stretch at 90 deg x 1' x 2 Supine over pool noodle cervical rotation for MFR x 20 rotations R and L Seated hamstring stretch x 1' x 2 BLE Seated piriformis stretch x 1' x 2 BLE Supine bridge w/ TA x 2/10 BLE Supine clams RTB w/ TA x 2/10 BLE Standing back extension x 2/10   NEUROMUSCULAR RE-EDUCATION: To improve balance and posture. Green swiss ball bounce x 1' Green SB LAQ w/ TA x 10 BLE Green SB marching alternately w/ TA x 10 BLE  MANUAL THERAPY: To promote reduced pain utilizing therapeutic massage. Theragun to R low back along paraspinals from T12 to L5 in L SL  03/03/24  Seated hamstring stretch x 30' B Seated figure 4 stretch x 30' B Supine LTR both ways x 10 B Supine chin tucks 2x10 Supine figure 4 stretch x 30' B Supine chin tuck + rotation B x 10 Supine ab sets with PPT 10x5 Supine TrA  + clamshell RTB 10x3   PATIENT EDUCATION:  Education details: HEP update Person educated: Patient Education method: Explanation, Demonstration, Verbal cues, Tactile cues, and Handouts Education comprehension: verbalized understanding, verbal cues required, tactile cues required, and needs further education  HOME EXERCISE PROGRAM: Access Code: [email or web address] URL: [email or web address] Date: 03/19/2024 Prepared by: Sol Gaskins  Exercises - Seated Figure 4 Piriformis Stretch  - 1 x daily - 7 x weekly - 1 sets - 1 reps - Supine Figure 4 Piriformis Stretch  - 1 x daily - 7 x weekly - 1 sets - 1 reps - Seated Hamstring Stretch  - 1 x daily - 7 x weekly - 1 sets - 1 reps - 1 min hold - Supine Lower Trunk Rotation  - 1 x daily - 7 x weekly - 3 sets - 10 reps - Supine Cervical Retraction with Towel  - 1 x daily - 7 x weekly - 3 sets - 10 reps - Supine Scapular Retraction  - 1 x daily - 7 x weekly - 3 sets - 10 reps - Seated Scapular Retraction  - 1 x daily - 7 x weekly - 3 sets - 10 reps - Supine Suboccipital Release with Tennis Balls  - 1 x daily - 7 x weekly - 3 sets - 10 reps - Supine Cervical Rotation AROM on Pillow  - 1 x daily - 7 x weekly - 2 sets - 10 reps - Hooklying Clamshell with Resistance  - 1 x daily - 7 x weekly - 2 sets - 10 reps - Supine Shoulder External Rotation with Resistance  - 1 x daily - 7 x weekly - 2 sets - 10 reps - Supine Shoulder Horizontal Abduction with Resistance  - 1 x daily - 7 x weekly - 2 sets - 10 reps   ASSESSMENT:  CLINICAL IMPRESSION:  Worked more on MT to reduce muscle tension in low back musculature with good response. Continued progressing lumbo-pelvic strengthening to tolerance. Moist heat to end session. PT remains necessary for pain, ROM, strength, HEP deficit.s  Continue per POC   OBJECTIVE IMPAIRMENTS: decreased ROM, decreased strength, impaired flexibility, postural dysfunction, and pain.   ACTIVITY LIMITATIONS: lifting,  bending, and squatting  PARTICIPATION LIMITATIONS: cleaning, laundry, driving, and community activity  PERSONAL FACTORS: Age and 1-2  comorbidities:   DM, HTN, RUE paresthesiaare also affecting patient's functional outcome.   REHAB POTENTIAL: Good  CLINICAL DECISION MAKING: Evolving/moderate complexity  EVALUATION COMPLEXITY: Moderate   GOALS: Goals reviewed with patient? Yes  SHORT TERM GOALS: Target date: 03/19/24  Patient will be independent with initial HEP to improve outcomes and carryover.  Baseline: 100% PT assist required for correct completion Goal status: PARTIALLY MET- 03/19/24   2.  Patient will report 25% improvement in neck and  back pain to improve QOL. Baseline: 8/10 worst 03/16/24:  5/10 Goal status: MET  LONG TERM GOALS: Target date: 04/18/24  Patient will be independent with ongoing/advanced HEP for self-management at home.  Baseline: no advanced HEP Yet 9/22:  adding to HEP Goal status: IN PROGRESS  2.  Patient will report 50-75% improvement in neck and  back pain to improve QOL.  Baseline: 8/10 worst Goal status: IN PROGRESS  3.  Patient will demonstrate improved posture to decrease muscle imbalance. Baseline: flattened cervical lordosis Goal status: INITIAL   5.  Patient will demonstrate full pain free cervical ROM for safety with driving.  Baseline: see ROM tables above 03/25/24:  see tables, updated Goal status: IN PROGRESS  6.  Patient will demonstrate full pain free lumbar ROM to perform ADLs.   Baseline: see ROM tables above Goal status: IN PROGRESS- 03/19/24 see above ROM table, improving  7.  Patient will demonstrate improved functional strength as demonstrated by 5/5 strength in BLE and BUE to complete her ADLs and normal activities. Baseline: see MMT tables above Goal status: INITIAL  8.  Patient will report </= 20% on NDI (MCID = 22%) to demonstrate improved functional ability.  Baseline: 46% Goal status: INITIAL   9.  Patient  will report </= 20% on Modified Oswestry (MCID = 12%) to demonstrate improved functional ability.  Baseline: 40% Goal status: INITIAL  10.  Patient to report ability to perform ADLs, household, and work-related tasks without limitation due to   pain, LOM or weakness Baseline: able to complete ADL, but in pain Goal status: INITIAL   PLAN:  PT FREQUENCY: 1-2x/week  PT DURATION: 8 weeks  PLANNED INTERVENTIONS: 97164- PT Re-evaluation, 97750- Physical Performance Testing, 97110-Therapeutic exercises, 97530- Therapeutic activity, [id number]- Neuromuscular re-education, 97535- Self Care, [id number]- Manual therapy, [id number]- Electrical stimulation (unattended), 97016- Vasopneumatic device, [id number]- Ultrasound, [id number]- Traction (mechanical), [id number]- Ionotophoresis 4mg /ml Dexamethasone , 79439 (1-2 muscles), 20561 (3+ muscles)- Dry Needling, Patient/Family education, Balance training, Stair training, Taping, Joint mobilization, Spinal mobilization, Cryotherapy, and Moist heat  PLAN FOR NEXT SESSION:  Try some towel neck stretching for L rotation and extension.  Continue w/ lumbar and cervical stretching/strengthening;  Needs outcome measures rechecked soon  Geofrey Silliman L Donell Tomkins, PTA 03/30/2024, 5:04 PM

## 2024-04-02 ENCOUNTER — Ambulatory Visit

## 2024-04-02 DIAGNOSIS — M5459 Other low back pain: Secondary | ICD-10-CM | POA: Diagnosis not present

## 2024-04-02 DIAGNOSIS — M6281 Muscle weakness (generalized): Secondary | ICD-10-CM | POA: Diagnosis not present

## 2024-04-02 DIAGNOSIS — M542 Cervicalgia: Secondary | ICD-10-CM | POA: Diagnosis not present

## 2024-04-02 DIAGNOSIS — R293 Abnormal posture: Secondary | ICD-10-CM

## 2024-04-02 NOTE — Therapy (Signed)
 OUTPATIENT PHYSICAL THERAPY CERVICAL AND THORACOLUMBAR TREATMENT   Patient Name: Patricia Rodgers MRN: [id number] DOB:[birth year], 68 y.o., female Today's Date: 04/02/2024   END OF SESSION:  PT End of Session - 04/02/24 1619     Visit Number 8    Date for Recertification  04/16/24    Authorization Type human[employer] MCR    PT Start Time 1613    PT Stop Time 1700    PT Time Calculation (min) 47 min    Activity Tolerance Patient tolerated treatment well    Behavior During Therapy WFL for tasks assessed/performed                Past Medical History:  Diagnosis Date   Breast mass    Diabetes mellitus without complication (HCC)    Pre diabetic   Hyperlipemia    Hypertension    Hypothyroidism    Internal hemorrhoids    Thyroid  disease    Past Surgical History:  Procedure Laterality Date   ABDOMINAL HYSTERECTOMY     APPENDECTOMY     BREAST EXCISIONAL BIOPSY Right 2019   DILATION AND CURETTAGE OF UTERUS     LEFT HEART CATHETERIZATION WITH CORONARY ANGIOGRAM N/A 04/26/2014   Procedure: LEFT HEART CATHETERIZATION WITH CORONARY ANGIOGRAM;  Surgeon: Erick JONELLE Bergamo, MD;  Location: [facility] CATH LAB;  Service: Cardiovascular;  Laterality: N/A;   RADIOACTIVE SEED GUIDED EXCISIONAL BREAST BIOPSY Right 05/13/2017   Procedure: RIGHT RADIOACTIVE SEED GUIDED EXCISIONAL BREAST BIOPSY ERAS PATHWAY;  Surgeon: Ethyl Lenis, MD;  Location: [facility];  Service: General;  Laterality: Right;   Patient Active Problem List   Diagnosis Date Noted   Diabetes mellitus without complication (HCC) 06/12/2021   Nuclear sclerotic cataract of both eyes 06/12/2021   Paresthesia of right upper extremity 07/03/2016   Angina pectoris 04/25/2014    PCP: Catalina Bare, MD   REFERRING PROVIDER: Catalina Bare, MD  REFERRING DIAG: M54.50 (ICD-10-CM) - Low back pain, unspecified M54.2 (ICD-10-CM) - Cervicalgia  THERAPY DIAG:  Cervicalgia  Other low back pain  Muscle weakness  (generalized)  Abnormal posture  RATIONALE FOR EVALUATION AND TREATMENT: Rehabilitation  ONSET DATE: 7/25  NEXT MD VISIT:    SUBJECTIVE:                                                                                                                                                                                                         SUBJECTIVE STATEMENT: Stiffness more in neck today, notes issues with bending over to sweep d/t low back  PAIN: Are you having pain? Yes:  NPRS scale: 4/10  Pain location: neck and shoulders Pain description: throbbing pain in neck and back Aggravating factors: looking down/reading increased neck pain with headaches;  looking to the L is painful;   Bending and lifting increase back pain Relieving factors: lying down on heat helps low back  PERTINENT HISTORY:  DM, HTN, RUE paresthesia  PRECAUTIONS: None  HAND DOMINANCE: Right  RED FLAGS: None  WEIGHT BEARING RESTRICTIONS: No  FALLS:  Has patient fallen in last 6 months? 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   PLOF: Independent with gait  PATIENT GOALS: not hurt in neck and back    OBJECTIVE:   DIAGNOSTIC FINDINGS: (from [facility] ED visit) CT CERVICAL SPINE FINDINGS  Alignment: Mild reversal of cervical lordosis. No subluxation. Facet alignment is normal  Skull base and vertebrae: No acute fracture. No primary bone lesion or focal pathologic process.  Soft tissues and spinal canal: No prevertebral fluid or swelling. No visible canal hematoma.  Disc levels: Mild disc space narrowing C4-C5, C5-C6 and C6-C7. Foraminal narrowing bilaterally at C5-C6  Upper chest: Negative.  Other: None    PATIENT SURVEYS:  ODI = 40 NDI =  46  SCREENING FOR RED FLAGS: Bowel or bladder incontinence: No Spinal tumors: No Cauda equina syndrome: No Compression fracture: No Abdominal aneurysm: No  COGNITION: Overall cognitive status: Within functional limits for tasks assessed     SENSATION: WFL  POSTURE:  rounded shoulders and forward head  PALPATION: TTP over bilateral low back from pelvis to mid thoracic spine in general;  TTP over bilateral C-spine and uppertraps down to thoracic spine diffuse pain  CERVICAL ROM:   Active ROM Eval 03/26/24  Flexion 100; p! 100%  Extension 50%; p! 60%; less p!  Right lateral flexion 50%; R sided p! 60; slight p!  Left lateral flexion 40% R sided pain 40%slight p!  Right rotation 60%; R sided pain 70%  Left rotation 40%; L sided pain 50%    (Blank rows = not tested)  UPPER EXTREMITY ROM:  Active ROM Right eval Left eval  Shoulder flexion 160 160  Shoulder extension    Shoulder abduction 160 160  Shoulder adduction    Shoulder internal rotation L4 L3  Shoulder external rotation Base of neck B same  Elbow flexion    Elbow extension    Wrist flexion    Wrist extension    Wrist ulnar deviation    Wrist radial deviation    Wrist pronation    Wrist supination      (Blank rows = not tested)  UPPER EXTREMITY MMT:  MMT Right eval Left eval R 04/02/24 L 04/02/24  Shoulder flexion 5 5 5 5   Shoulder extension      Shoulder abduction 4 4+ 5 5  Shoulder adduction      Shoulder internal rotation 5 5 5 5   Shoulder external rotation 5 5 5 5   Middle trapezius      Lower trapezius      Elbow flexion      Elbow extension      Wrist flexion      Wrist extension      Wrist ulnar deviation      Wrist radial deviation      Wrist pronation      Wrist  supination      Grip strength = B     (Blank rows = not tested)  LUMBAR ROM:   Active  Eval 03/19/24  Flexion To knees; p! To ankles  Extension 75%; p! 60%  Right lateral flexion To mid thigh; p! To lateral knee  Left lateral  flexion To mid thigh To lateral knee  Right rotation 75%; some p! 50%  Left rotation 75%; some p! 50%    (Blank rows = not tested)  MUSCLE LENGTH: Hamstrings: Right SLR 75 deg; Left SLR 80 deg Thomas test: Right WNL deg; Left WNL deg Hamstrings: mild tightness ITB: WNL Piriformis: mild tightness BLE Hip flexors: NT Quads: NT Heelcord: NT  LOWER EXTREMITY ROM:     Active  Right eval Left eval  Hip flexion 120+ 120+  Hip extension    Hip abduction    Hip adduction    Hip internal rotation 40 40  Hip external rotation 50-60 50-60  Knee flexion    Knee extension    Ankle dorsiflexion    Ankle plantarflexion    Ankle inversion    Ankle eversion     (Blank rows = not tested)  LOWER EXTREMITY MMT:    MMT Right eval Left eval  Hip flexion 4 4  Hip extension 4- 4-  Hip abduction 4- 4-  Hip adduction    Hip internal rotation 4- 4-  Hip external rotation 4+ 4+  Knee flexion 5 5  Knee extension 5 5  Ankle dorsiflexion 4 4  Ankle plantarflexion    Ankle inversion    Ankle eversion      (Blank rows = not tested)  CERVICAL SPECIAL TESTS:  TBD  LUMBAR SPECIAL TESTS:  Straight leg raise test: Negative and Slump test: Negative  FUNCTIONAL TESTS:    GAIT: Distance walked: into clinic from parking lot Assistive device utilized: None Level of assistance: Complete Independence Gait pattern: WFL Comments: gait is WNL   TODAY'S TREATMENT:  04/02/24 UBE  3 min fwd/3 min back Checked UE strength, NDI Standing shoulder ext 15lb x 20  Standing rows 10lb x 20  Standing face pulls 10lb x 20  Seated on orange pball pallof press GTB x 20 each way, reciprocal march and arm raise x 5,- LOB x 1 with this on pball, B hip abd x 5 Praying mantis x 10  03/30/24 UBE L1.5 4 min fwd/2 min back STM to B lumbar paraspinals; foam roll to thoracolumbar PS all in prone Supine DKTC, bridge orange pball x 20 each Supine LTR orange pball x 10 Supine clams blue TB x 20 Seated rows 15lb x  20 Hot pack to low back in sitting x 5 min  03/26/24 NuStep L5 x 6'  Supine bridge RTB x 2/10 BLE Supine manual ham stretch x 1' x 2 RLE Supine cross over piriformis stretch x 1' x 2 RLE Supine FABER x 1' x 2 RLE Bridge w/ RTB x 2/10 BLE Supine peanut ball rollouts w/ TA x 10;  w/ PPT x 10 BLE Bridge on ball w/ TA x 2/10 BLE Standing plank at joymor rail w/ hip ext and cerv retraction x 10 BLE Standing row blue TB x 2/10 BUE MFI walkouts RTB x 5 BUE Standing back ext x 10   MANUAL THERAPY: To promote reduced pain utilizing therapeutic massage. Theragun to R low back along paraspinals from T12 to L5 in L SL   03/23/24 Nustep L5x54min  STM to B UT, levator, scalenes  with pin & stretch to scalenes, suboccipital release; Seated scalene stretch 2x30 B Lat pulldown 20lb x 10  Seated rows 15lb high grip x 10 Standing open books x 5 each way   03/19/24 Nustep L5x44min Supine B shld ER RTB 2x10 Supine B shld horiz ABD RTB 2x10 Supine LTR 5x10 both ways Bridge RTB + TrA 2x10 Supine march RTB + TrA 2x10 Seated green pball shoulder ext RTB x 20 Seated green pball shoulder row RTB x 10 Seated green pball march 2x10; LAQ 2x10 Seated 3 way flexion stretch with green pball x 1 min  03/16/24 THERAPEUTIC EXERCISE: To improve strength.  Demonstration, verbal and tactile cues throughout for technique. NuStep L5 x 5'  Standing row GTB x 20 BUE Doorway shoulder ER RTB x 2/10 BUE Doorway shoulder HABD RTB x 2/10 BUE Doorway pec stretch at 90 deg x 1' x 2 Supine over pool noodle cervical rotation for MFR x 20 rotations R and L Seated hamstring stretch x 1' x 2 BLE Seated piriformis stretch x 1' x 2 BLE Supine bridge w/ TA x 2/10 BLE Supine clams RTB w/ TA x 2/10 BLE Standing back extension x 2/10   NEUROMUSCULAR RE-EDUCATION: To improve balance and posture. Green swiss ball bounce x 1' Green SB LAQ w/ TA x 10 BLE Green SB marching alternately w/ TA x 10 BLE  MANUAL THERAPY: To  promote reduced pain utilizing therapeutic massage. Theragun to R low back along paraspinals from T12 to L5 in L SL  03/03/24  Seated hamstring stretch x 30' B Seated figure 4 stretch x 30' B Supine LTR both ways x 10 B Supine chin tucks 2x10 Supine figure 4 stretch x 30' B Supine chin tuck + rotation B x 10 Supine ab sets with PPT 10x5 Supine TrA + clamshell RTB 10x3   PATIENT EDUCATION:  Education details: HEP update Person educated: Patient Education method: Explanation, Demonstration, Verbal cues, Tactile cues, and Handouts Education comprehension: verbalized understanding, verbal cues required, tactile cues required, and needs further education  HOME EXERCISE PROGRAM: Access Code: [email or web address] URL: [email or web address] Date: 03/19/2024 Prepared by: Densel Kronick  Exercises - Seated Figure 4 Piriformis Stretch  - 1 x daily - 7 x weekly - 1 sets - 1 reps - Supine Figure 4 Piriformis Stretch  - 1 x daily - 7 x weekly - 1 sets - 1 reps - Seated Hamstring Stretch  - 1 x daily - 7 x weekly - 1 sets - 1 reps - 1 min hold - Supine Lower Trunk Rotation  - 1 x daily - 7 x weekly - 3 sets - 10 reps - Supine Cervical Retraction with Towel  - 1 x daily - 7 x weekly - 3 sets - 10 reps - Supine Scapular Retraction  - 1 x daily - 7 x weekly - 3 sets - 10 reps - Seated Scapular Retraction  - 1 x daily - 7 x weekly - 3 sets - 10 reps - Supine Suboccipital Release with Tennis Balls  - 1 x daily - 7 x weekly - 3 sets - 10 reps - Supine Cervical Rotation AROM on Pillow  - 1 x daily - 7 x weekly - 2 sets - 10 reps - Hooklying Clamshell with Resistance  - 1 x daily - 7 x weekly - 2 sets - 10 reps - Supine Shoulder External Rotation with Resistance  - 1 x daily - 7 x weekly - 2 sets - 10 reps - Supine Shoulder Horizontal  Abduction with Resistance  - 1 x daily - 7 x weekly - 2 sets - 10 reps   ASSESSMENT:  CLINICAL IMPRESSION:  Focused treatment on persicapular stength and postural  alignment to improve mechanics of shoulder to reduce pain with daily activities. Worked on core strength as patient reporting issues with bending over while sweeping or moping floors. Will try to work more on these functional activities next visit.    OBJECTIVE IMPAIRMENTS: decreased ROM, decreased strength, impaired flexibility, postural dysfunction, and pain.   ACTIVITY LIMITATIONS: lifting, bending, and squatting  PARTICIPATION LIMITATIONS: cleaning, laundry, driving, and community activity  PERSONAL FACTORS: Age and 1-2 comorbidities:   DM, HTN, RUE paresthesiaare also affecting patient's functional outcome.   REHAB POTENTIAL: Good  CLINICAL DECISION MAKING: Evolving/moderate complexity  EVALUATION COMPLEXITY: Moderate   GOALS: Goals reviewed with patient? Yes  SHORT TERM GOALS: Target date: 03/19/24  Patient will be independent with initial HEP to improve outcomes and carryover.  Baseline: 100% PT assist required for correct completion Goal status:  MET- 04/02/24   2.  Patient will report 25% improvement in neck and  back pain to improve QOL. Baseline: 8/10 worst 03/16/24:  5/10 Goal status: MET  LONG TERM GOALS: Target date: 04/18/24  Patient will be independent with ongoing/advanced HEP for self-management at home.  Baseline: no advanced HEP Yet 9/22:  adding to HEP Goal status: IN PROGRESS  2.  Patient will report 50-75% improvement in neck and  back pain to improve QOL.  Baseline: 8/10 worst Goal status: IN PROGRESS  3.  Patient will demonstrate improved posture to decrease muscle imbalance. Baseline: flattened cervical lordosis Goal status: INITIAL   5.  Patient will demonstrate full pain free cervical ROM for safety with driving.  Baseline: see ROM tables above 03/25/24:  see tables, updated Goal status: IN PROGRESS  6.  Patient will demonstrate full pain free lumbar ROM to perform ADLs.   Baseline: see ROM tables above Goal status: IN PROGRESS- 03/19/24  see above ROM table, improving  7.  Patient will demonstrate improved functional strength as demonstrated by 5/5 strength in BLE and BUE to complete her ADLs and normal activities. Baseline: see MMT tables above Goal status: IN PROGRESS- MET for UE strength 04/02/24  8.  Patient will report </= 20% on NDI (MCID = 22%) to demonstrate improved functional ability.  Baseline: 46% Goal status: IN PROGRESS- 04/02/24 (17 / 50 = 34.0 %)  9.  Patient will report </= 20% on Modified Oswestry (MCID = 12%) to demonstrate improved functional ability.  Baseline: 40% Goal status: INITIAL  10.  Patient to report ability to perform ADLs, household, and work-related tasks without limitation due to   pain, LOM or weakness Baseline: able to complete ADL, but in pain Goal status: INITIAL   PLAN:  PT FREQUENCY: 1-2x/week  PT DURATION: 8 weeks  PLANNED INTERVENTIONS: 97164- PT Re-evaluation, 97750- Physical Performance Testing, 97110-Therapeutic exercises, 97530- Therapeutic activity, 97112- Neuromuscular re-education, 97535- Self Care, [id number]- Manual therapy, [id number]- Electrical stimulation (unattended), 97016- Vasopneumatic device, [id number]- Ultrasound, [id number]- Traction (mechanical), [id number]- Ionotophoresis 4mg /ml Dexamethasone , 79439 (1-2 muscles), 20561 (3+ muscles)- Dry Needling, Patient/Family education, Balance training, Stair training, Taping, Joint mobilization, Spinal mobilization, Cryotherapy, and Moist heat  PLAN FOR NEXT SESSION:  sweeping simulations; core strength, postural strength  Sol LITTIE Gaskins, PTA 04/02/2024, 5:08 PM

## 2024-04-06 ENCOUNTER — Ambulatory Visit

## 2024-04-06 DIAGNOSIS — M5459 Other low back pain: Secondary | ICD-10-CM

## 2024-04-06 DIAGNOSIS — R293 Abnormal posture: Secondary | ICD-10-CM

## 2024-04-06 DIAGNOSIS — M542 Cervicalgia: Secondary | ICD-10-CM

## 2024-04-06 DIAGNOSIS — M6281 Muscle weakness (generalized): Secondary | ICD-10-CM | POA: Diagnosis not present

## 2024-04-06 NOTE — Therapy (Signed)
 OUTPATIENT PHYSICAL THERAPY CERVICAL AND THORACOLUMBAR TREATMENT   Patient Name: Patricia Rodgers MRN: [id number] DOB:[birth year], 68 y.o., female Today's Date: 04/06/2024   END OF SESSION:  PT End of Session - 04/06/24 1625     Visit Number 9    Date for Recertification  04/16/24    Authorization Type humana MCR    PT Start Time 1620    PT Stop Time 1658    PT Time Calculation (min) 38 min    Activity Tolerance Patient tolerated treatment well    Behavior During Therapy WFL for tasks assessed/performed                 Past Medical History:  Diagnosis Date   Breast mass    Diabetes mellitus without complication (HCC)    Pre diabetic   Hyperlipemia    Hypertension    Hypothyroidism    Internal hemorrhoids    Thyroid  disease    Past Surgical History:  Procedure Laterality Date   ABDOMINAL HYSTERECTOMY     APPENDECTOMY     BREAST EXCISIONAL BIOPSY Right 2019   DILATION AND CURETTAGE OF UTERUS     LEFT HEART CATHETERIZATION WITH CORONARY ANGIOGRAM N/A 04/26/2014   Procedure: LEFT HEART CATHETERIZATION WITH CORONARY ANGIOGRAM;  Surgeon: Erick JONELLE Bergamo, MD;  Location: [facility] CATH LAB;  Service: Cardiovascular;  Laterality: N/A;   RADIOACTIVE SEED GUIDED EXCISIONAL BREAST BIOPSY Right 05/13/2017   Procedure: RIGHT RADIOACTIVE SEED GUIDED EXCISIONAL BREAST BIOPSY ERAS PATHWAY;  Surgeon: Ethyl Lenis, MD;  Location: [facility];  Service: General;  Laterality: Right;   Patient Active Problem List   Diagnosis Date Noted   Diabetes mellitus without complication (HCC) 06/12/2021   Nuclear sclerotic cataract of both eyes 06/12/2021   Paresthesia of right upper extremity 07/03/2016   Angina pectoris 04/25/2014    PCP: Catalina Bare, MD   REFERRING PROVIDER: Catalina Bare, MD  REFERRING DIAG: M54.50 (ICD-10-CM) - Low back pain, unspecified M54.2 (ICD-10-CM) - Cervicalgia  THERAPY DIAG:  Cervicalgia  Other low back pain  Muscle weakness  (generalized)  Abnormal posture  RATIONALE FOR EVALUATION AND TREATMENT: Rehabilitation  ONSET DATE: 7/25  NEXT MD VISIT:    SUBJECTIVE:                                                                                                                                                                                                         SUBJECTIVE STATEMENT: Stiffness more in neck today, notes issues with bending over to sweep d/t low back  PAIN: Are you having pain?  Yes: NPRS scale: 4/10  Pain location: neck and shoulders Pain description: throbbing pain in neck and back Aggravating factors: looking down/reading increased neck pain with headaches;  looking to the L is painful;   Bending and lifting increase back pain Relieving factors: lying down on heat helps low back  PERTINENT HISTORY:  DM, HTN, RUE paresthesia  PRECAUTIONS: None  HAND DOMINANCE: Right  RED FLAGS: None  WEIGHT BEARING RESTRICTIONS: No  FALLS:  Has patient fallen in last 6 months? 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   PLOF: Independent with gait  PATIENT GOALS: not hurt in neck and back    OBJECTIVE:   DIAGNOSTIC FINDINGS: (from [facility] ED visit) CT CERVICAL SPINE FINDINGS  Alignment: Mild reversal of cervical lordosis. No subluxation. Facet alignment is normal  Skull base and vertebrae: No acute fracture. No primary bone lesion or focal pathologic process.  Soft tissues and spinal canal: No prevertebral fluid or swelling. No visible canal hematoma.  Disc levels: Mild disc space narrowing C4-C5, C5-C6 and C6-C7. Foraminal narrowing bilaterally at C5-C6  Upper chest: Negative.  Other: None    PATIENT SURVEYS:  ODI = 40 NDI =  46  SCREENING FOR RED FLAGS: Bowel or bladder incontinence: No Spinal tumors: No Cauda equina syndrome: No Compression fracture: No Abdominal aneurysm: No  COGNITION: Overall cognitive status: Within functional limits for tasks assessed     SENSATION: WFL  POSTURE:  rounded shoulders and forward head  PALPATION: TTP over bilateral low back from pelvis to mid thoracic spine in general;  TTP over bilateral C-spine and uppertraps down to thoracic spine diffuse pain  CERVICAL ROM:   Active ROM Eval 03/26/24  Flexion 100; p! 100%  Extension 50%; p! 60%; less p!  Right lateral flexion 50%; R sided p! 60; slight p!  Left lateral flexion 40% R sided pain 40%slight p!  Right rotation 60%; R sided pain 70%  Left rotation 40%; L sided pain 50%    (Blank rows = not tested)  UPPER EXTREMITY ROM:  Active ROM Right eval Left eval  Shoulder flexion 160 160  Shoulder extension    Shoulder abduction 160 160  Shoulder adduction    Shoulder internal rotation L4 L3  Shoulder external rotation Base of neck B same  Elbow flexion    Elbow extension    Wrist flexion    Wrist extension    Wrist ulnar deviation    Wrist radial deviation    Wrist pronation    Wrist supination      (Blank rows = not tested)  UPPER EXTREMITY MMT:  MMT Right eval Left eval R 04/02/24 L 04/02/24  Shoulder flexion 5 5 5 5   Shoulder extension      Shoulder abduction 4 4+ 5 5  Shoulder adduction      Shoulder internal rotation 5 5 5 5   Shoulder external rotation 5 5 5 5   Middle trapezius      Lower trapezius      Elbow flexion      Elbow extension      Wrist flexion      Wrist extension      Wrist ulnar deviation      Wrist radial deviation      Wrist pronation  Wrist supination      Grip strength = B     (Blank rows = not tested)  LUMBAR ROM:   Active  Eval 03/19/24  Flexion To knees; p! To ankles  Extension 75%; p! 60%  Right lateral flexion To mid thigh; p! To lateral knee  Left lateral  flexion To mid thigh To lateral knee  Right rotation 75%; some p! 50%  Left rotation 75%; some p! 50%    (Blank rows = not tested)  MUSCLE LENGTH: Hamstrings: Right SLR 75 deg; Left SLR 80 deg Thomas test: Right WNL deg; Left WNL deg Hamstrings: mild tightness ITB: WNL Piriformis: mild tightness BLE Hip flexors: NT Quads: NT Heelcord: NT  LOWER EXTREMITY ROM:     Active  Right eval Left eval  Hip flexion 120+ 120+  Hip extension    Hip abduction    Hip adduction    Hip internal rotation 40 40  Hip external rotation 50-60 50-60  Knee flexion    Knee extension    Ankle dorsiflexion    Ankle plantarflexion    Ankle inversion    Ankle eversion     (Blank rows = not tested)  LOWER EXTREMITY MMT:    MMT Right eval Left eval  Hip flexion 4 4  Hip extension 4- 4-  Hip abduction 4- 4-  Hip adduction    Hip internal rotation 4- 4-  Hip external rotation 4+ 4+  Knee flexion 5 5  Knee extension 5 5  Ankle dorsiflexion 4 4  Ankle plantarflexion    Ankle inversion    Ankle eversion      (Blank rows = not tested)  CERVICAL SPECIAL TESTS:  TBD  LUMBAR SPECIAL TESTS:  Straight leg raise test: Negative and Slump test: Negative  FUNCTIONAL TESTS:    GAIT: Distance walked: into clinic from parking lot Assistive device utilized: None Level of assistance: Complete Independence Gait pattern: WFL Comments: gait is WNL   TODAY'S TREATMENT:  04/06/24 Nustep l5x96min Seated on green pabll  Seated shld and hp ER 2x10  Seated arm raise and march 2x10  Seated LAQ 2x10 Lat pull down 25lb x 20  Shoulder ext 15lb  Rows low 20lb x 20; 15lb mid x 20 Sweeping and mopping motions- cued for good posture and body mechanics throughout these motions, went around the whole clinic mopping  04/02/24 UBE  3 min fwd/3 min back Checked UE strength, NDI Standing shoulder ext 15lb x 20  Standing rows 10lb x 20  Standing face pulls 10lb x 20  Seated on orange pball pallof press GTB x  20 each way, reciprocal march and arm raise x 5,- LOB x 1 with this on pball, B hip abd x 5 Praying mantis x 10  03/30/24 UBE L1.5 4 min fwd/2 min back STM to B lumbar paraspinals; foam roll to thoracolumbar PS all in prone Supine DKTC, bridge orange pball x 20 each Supine LTR orange pball x 10 Supine clams blue TB x 20 Seated rows 15lb x 20 Hot pack to low back in sitting x 5 min  03/26/24 NuStep L5 x 6'  Supine bridge RTB x 2/10 BLE Supine manual ham stretch x 1' x 2 RLE Supine cross over piriformis stretch x 1' x 2 RLE Supine FABER x 1' x 2 RLE Bridge w/ RTB x 2/10 BLE Supine peanut ball rollouts w/ TA x 10;  w/ PPT x 10 BLE Bridge on ball w/ TA x 2/10 BLE Standing plank  at [employer] rail w/ hip ext and cerv retraction x 10 BLE Standing row blue TB x 2/10 BUE MFI walkouts RTB x 5 BUE Standing back ext x 10   MANUAL THERAPY: To promote reduced pain utilizing therapeutic massage. Theragun to R low back along paraspinals from T12 to L5 in L SL   03/23/24 Nustep L5x18min  STM to B UT, levator, scalenes with pin & stretch to scalenes, suboccipital release; Seated scalene stretch 2x30 B Lat pulldown 20lb x 10  Seated rows 15lb high grip x 10 Standing open books x 5 each way   03/19/24 Nustep L5x32min Supine B shld ER RTB 2x10 Supine B shld horiz ABD RTB 2x10 Supine LTR 5x10 both ways Bridge RTB + TrA 2x10 Supine march RTB + TrA 2x10 Seated green pball shoulder ext RTB x 20 Seated green pball shoulder row RTB x 10 Seated green pball march 2x10; LAQ 2x10 Seated 3 way flexion stretch with green pball x 1 min  03/16/24 THERAPEUTIC EXERCISE: To improve strength.  Demonstration, verbal and tactile cues throughout for technique. NuStep L5 x 5'  Standing row GTB x 20 BUE Doorway shoulder ER RTB x 2/10 BUE Doorway shoulder HABD RTB x 2/10 BUE Doorway pec stretch at 90 deg x 1' x 2 Supine over pool noodle cervical rotation for MFR x 20 rotations R and L Seated hamstring  stretch x 1' x 2 BLE Seated piriformis stretch x 1' x 2 BLE Supine bridge w/ TA x 2/10 BLE Supine clams RTB w/ TA x 2/10 BLE Standing back extension x 2/10   NEUROMUSCULAR RE-EDUCATION: To improve balance and posture. Green swiss ball bounce x 1' Green SB LAQ w/ TA x 10 BLE Green SB marching alternately w/ TA x 10 BLE  MANUAL THERAPY: To promote reduced pain utilizing therapeutic massage. Theragun to R low back along paraspinals from T12 to L5 in L SL  03/03/24  Seated hamstring stretch x 30' B Seated figure 4 stretch x 30' B Supine LTR both ways x 10 B Supine chin tucks 2x10 Supine figure 4 stretch x 30' B Supine chin tuck + rotation B x 10 Supine ab sets with PPT 10x5 Supine TrA + clamshell RTB 10x3   PATIENT EDUCATION:  Education details: HEP update Person educated: Patient Education method: Explanation, Demonstration, Verbal cues, Tactile cues, and Handouts Education comprehension: verbalized understanding, verbal cues required, tactile cues required, and needs further education  HOME EXERCISE PROGRAM: Access Code: [email or web address] URL: [email or web address] Date: 03/19/2024 Prepared by: Gracielynn Birkel  Exercises - Seated Figure 4 Piriformis Stretch  - 1 x daily - 7 x weekly - 1 sets - 1 reps - Supine Figure 4 Piriformis Stretch  - 1 x daily - 7 x weekly - 1 sets - 1 reps - Seated Hamstring Stretch  - 1 x daily - 7 x weekly - 1 sets - 1 reps - 1 min hold - Supine Lower Trunk Rotation  - 1 x daily - 7 x weekly - 3 sets - 10 reps - Supine Cervical Retraction with Towel  - 1 x daily - 7 x weekly - 3 sets - 10 reps - Supine Scapular Retraction  - 1 x daily - 7 x weekly - 3 sets - 10 reps - Seated Scapular Retraction  - 1 x daily - 7 x weekly - 3 sets - 10 reps - Supine Suboccipital Release with Tennis Balls  - 1 x daily - 7 x weekly - 3 sets - 10 reps -  Supine Cervical Rotation AROM on Pillow  - 1 x daily - 7 x weekly - 2 sets - 10 reps - Hooklying Clamshell with  Resistance  - 1 x daily - 7 x weekly - 2 sets - 10 reps - Supine Shoulder External Rotation with Resistance  - 1 x daily - 7 x weekly - 2 sets - 10 reps - Supine Shoulder Horizontal Abduction with Resistance  - 1 x daily - 7 x weekly - 2 sets - 10 reps   ASSESSMENT:  CLINICAL IMPRESSION:  Continued progressing core stab, postural strengthening, and added functional activities such as mopping and sweeping. Provided instruction throughout session on correct form, as well as proper body mechanics during mopping and sweeping. Pt doing very well, wished to end PT coming up this Thursday.   OBJECTIVE IMPAIRMENTS: decreased ROM, decreased strength, impaired flexibility, postural dysfunction, and pain.   ACTIVITY LIMITATIONS: lifting, bending, and squatting  PARTICIPATION LIMITATIONS: cleaning, laundry, driving, and community activity  PERSONAL FACTORS: Age and 1-2 comorbidities:   DM, HTN, RUE paresthesiaare also affecting patient's functional outcome.   REHAB POTENTIAL: Good  CLINICAL DECISION MAKING: Evolving/moderate complexity  EVALUATION COMPLEXITY: Moderate   GOALS: Goals reviewed with patient? Yes  SHORT TERM GOALS: Target date: 03/19/24  Patient will be independent with initial HEP to improve outcomes and carryover.  Baseline: 100% PT assist required for correct completion Goal status:  MET- 04/02/24   2.  Patient will report 25% improvement in neck and  back pain to improve QOL. Baseline: 8/10 worst 03/16/24:  5/10 Goal status: MET  LONG TERM GOALS: Target date: 04/18/24  Patient will be independent with ongoing/advanced HEP for self-management at home.  Baseline: no advanced HEP Yet 9/22:  adding to HEP Goal status: IN PROGRESS  2.  Patient will report 50-75% improvement in neck and  back pain to improve QOL.  Baseline: 8/10 worst Goal status: IN PROGRESS  3.  Patient will demonstrate improved posture to decrease muscle imbalance. Baseline: flattened cervical  lordosis Goal status: INITIAL   5.  Patient will demonstrate full pain free cervical ROM for safety with driving.  Baseline: see ROM tables above 03/25/24:  see tables, updated Goal status: IN PROGRESS  6.  Patient will demonstrate full pain free lumbar ROM to perform ADLs.   Baseline: see ROM tables above Goal status: IN PROGRESS- 03/19/24 see above ROM table, improving  7.  Patient will demonstrate improved functional strength as demonstrated by 5/5 strength in BLE and BUE to complete her ADLs and normal activities. Baseline: see MMT tables above Goal status: IN PROGRESS- MET for UE strength 04/02/24   8.  Patient will report </= 20% on NDI (MCID = 22%) to demonstrate improved functional ability.  Baseline: 46% Goal status: IN PROGRESS- 04/02/24 (17 / 50 = 34.0 %)  9.  Patient will report </= 20% on Modified Oswestry (MCID = 12%) to demonstrate improved functional ability.  Baseline: 40% Goal status: INITIAL  10.  Patient to report ability to perform ADLs, household, and work-related tasks without limitation due to   pain, LOM or weakness Baseline: able to complete ADL, but in pain Goal status: INITIAL   PLAN:  PT FREQUENCY: 1-2x/week  PT DURATION: 8 weeks  PLANNED INTERVENTIONS: 97164- PT Re-evaluation, 97750- Physical Performance Testing, 97110-Therapeutic exercises, 97530- Therapeutic activity, [id number]- Neuromuscular re-education, 97535- Self Care, [id number]- Manual therapy, [id number]- Electrical stimulation (unattended), 97016- Vasopneumatic device, [id number]- Ultrasound, [id number]- Traction (mechanical), [id number]- Ionotophoresis 4mg /ml Dexamethasone , 79439 (1-2 muscles), 20561 (3+  muscles)- Dry Needling, Patient/Family education, Balance training, Stair training, Taping, Joint mobilization, Spinal mobilization, Cryotherapy, and Moist heat  PLAN FOR NEXT SESSION:  sweeping simulations; core strength, postural strength  Sol LITTIE Gaskins, PTA 04/06/2024, 5:27 PM

## 2024-04-09 ENCOUNTER — Ambulatory Visit: Admitting: Rehabilitation

## 2024-04-09 DIAGNOSIS — M6281 Muscle weakness (generalized): Secondary | ICD-10-CM

## 2024-04-09 DIAGNOSIS — M5459 Other low back pain: Secondary | ICD-10-CM

## 2024-04-09 DIAGNOSIS — M542 Cervicalgia: Secondary | ICD-10-CM

## 2024-04-09 DIAGNOSIS — R293 Abnormal posture: Secondary | ICD-10-CM | POA: Diagnosis not present
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
## 2024-05-18 DIAGNOSIS — I10 Essential (primary) hypertension: Secondary | ICD-10-CM | POA: Diagnosis not present

## 2024-05-18 DIAGNOSIS — E559 Vitamin D deficiency, unspecified: Secondary | ICD-10-CM | POA: Diagnosis not present

## 2024-05-18 DIAGNOSIS — E119 Type 2 diabetes mellitus without complications: Secondary | ICD-10-CM | POA: Diagnosis not present

## 2024-05-18 DIAGNOSIS — E039 Hypothyroidism, unspecified: Secondary | ICD-10-CM | POA: Diagnosis not present

## 2024-05-18 DIAGNOSIS — E782 Mixed hyperlipidemia: Secondary | ICD-10-CM | POA: Diagnosis not present

## 2024-05-18 DIAGNOSIS — R7989 Other specified abnormal findings of blood chemistry: Secondary | ICD-10-CM | POA: Diagnosis not present
